# Patient Record
Sex: Male | Born: 1965 | Race: White | Hispanic: No | Marital: Single | Smoking: Current every day smoker
Health system: Southern US, Community
[De-identification: ages and names within clinical notes are randomized; demographics above are authoritative.]

## PROBLEM LIST (undated history)

## (undated) DIAGNOSIS — R7303 Prediabetes: Secondary | ICD-10-CM

## (undated) DIAGNOSIS — F17200 Nicotine dependence, unspecified, uncomplicated: Secondary | ICD-10-CM

## (undated) DIAGNOSIS — E785 Hyperlipidemia, unspecified: Secondary | ICD-10-CM

## (undated) DIAGNOSIS — I10 Essential (primary) hypertension: Secondary | ICD-10-CM

## (undated) HISTORY — PX: COLONOSCOPY: SHX174

## (undated) HISTORY — DX: Nicotine dependence, unspecified, uncomplicated: F17.200

## (undated) HISTORY — DX: Prediabetes: R73.03

## (undated) HISTORY — PX: UPPER GASTROINTESTINAL ENDOSCOPY: SHX188

## (undated) HISTORY — DX: Hyperlipidemia, unspecified: E78.5

---

## 2008-04-19 ENCOUNTER — Encounter: Admission: RE | Admit: 2008-04-19 | Discharge: 2008-04-19 | Payer: Self-pay | Admitting: Family Medicine

## 2017-08-19 DIAGNOSIS — I10 Essential (primary) hypertension: Secondary | ICD-10-CM | POA: Diagnosis not present

## 2017-08-19 DIAGNOSIS — J069 Acute upper respiratory infection, unspecified: Secondary | ICD-10-CM | POA: Diagnosis not present

## 2017-08-19 DIAGNOSIS — J029 Acute pharyngitis, unspecified: Secondary | ICD-10-CM | POA: Diagnosis not present

## 2018-03-11 ENCOUNTER — Ambulatory Visit (INDEPENDENT_AMBULATORY_CARE_PROVIDER_SITE_OTHER): Payer: 59 | Admitting: Family Medicine

## 2018-03-11 VITALS — BP 110/80 | HR 80 | Temp 97.9°F | Ht 70.0 in | Wt 202.8 lb

## 2018-03-11 DIAGNOSIS — Z0000 Encounter for general adult medical examination without abnormal findings: Secondary | ICD-10-CM

## 2018-03-11 NOTE — Patient Instructions (Signed)
It was great to meet you today! Thank you for letting me participate in your care!  Today, I performed a routine physical exam. You had no abnormal or concerning findings. We also discussed your recent desire to quit smoking. I would love to help you achieve the goal of being tobacco free.  Please schedule for an appointment in one month so we can discuss several strategies for achieving this goal.  Be well, [name] Brand Siever, DO PGY-1, [facility]

## 2018-03-11 NOTE — Progress Notes (Signed)
Subjective: Chief Complaint  Patient presents with  . Annual Exam  . Establish Care     HPI: Robert Lambert is a 52 y.o. presenting to clinic today to discuss the following:  Routine Yearly Physical Robert Lambert is a 52y/o male who presents needing a routine yearly physical for his work. He is being [facility]. He is unsure but is considering switching to our office due to the convenient location. He has no complaints. He is interested in quitting smoking. We discussed that if he would like to establish care with us he could return in one month and we would focus on smoking cessation techniques and possibly medications that could help him.  He denies fever, chills, weight loss, chest pain, SOB, difficulty breathing, abdominal pain, nausea, vomiting, urinary frequency, constipation, diarrhea, or blood in stool.  Health Maintenance: None     ROS noted in HPI.   Past Medical, Surgical, Social, and Family History Reviewed & Updated per EMR.   Pertinent Historical Findings include:   Social History   Tobacco Use  Smoking Status Not on file   Objective: BP 110/80 (BP Location: Left Arm, Patient Position: Sitting, Cuff Size: Normal)   Pulse 80   Temp 97.9 F (36.6 C) (Oral)   Ht 5\' 10"  (1.778 m)   Wt 202 lb 12.8 oz (92 kg)   SpO2 96%   BMI 29.10 kg/m  Vitals and nursing notes reviewed  Physical Exam  Constitutional: He is oriented to person, place, and time. He appears well-developed and well-nourished. No distress.  HENT:  Head: Normocephalic and atraumatic.  Right Ear: External ear normal.  Left Ear: External ear normal.  Nose: Nose normal.  Mouth/Throat: Oropharynx is clear and moist. No oropharyngeal exudate.  Eyes: Pupils are equal, round, and reactive to light. Conjunctivae and EOM are normal.  Neck: Normal range of motion. Neck supple. No thyromegaly present.  Cardiovascular: Normal rate, regular rhythm, normal heart  sounds and intact distal pulses.  No murmur heard. Pulmonary/Chest: Effort normal and breath sounds normal. No stridor. No respiratory distress. He has no wheezes. He has no rales.  Abdominal: Soft. Bowel sounds are normal. He exhibits no distension. There is no tenderness. There is no guarding. No hernia.  Musculoskeletal: Normal range of motion. He exhibits no edema or deformity.  Neurological: He is alert and oriented to person, place, and time. He displays normal reflexes. No cranial nerve deficit. Coordination normal.  Skin: Skin is warm and dry. Capillary refill takes less than 2 seconds. No rash noted. No erythema.   No results found for this or any previous visit (from the past 72 hour(s)).  Assessment/Plan:  Routine general medical examination at a health care facility Patient may establish care here but is unsure given he made this appointment to get a routine physical exam before a company deadline.  If he returns we will discuss smoking cessation and begin to address routine health maintenance.      PATIENT EDUCATION PROVIDED: See AVS    Diagnosis and plan along with any newly prescribed medication(s) were discussed in detail with this patient today. The patient verbalized understanding and agreed with the plan. Patient advised if symptoms worsen return to clinic or ER.   Health Maintainance:   No orders of the defined types were placed in this encounter.   No orders of the defined types were placed in this encounter.    Robert Schickim Vonzell Lindblad, DO 03/18/2018,  2:06 AM PGY-1, [facility]

## 2018-03-18 DIAGNOSIS — Z0000 Encounter for general adult medical examination without abnormal findings: Secondary | ICD-10-CM | POA: Insufficient documentation

## 2018-03-18 NOTE — Assessment & Plan Note (Signed)
Patient may establish care here but is unsure given he made this appointment to get a routine physical exam before a company deadline.  If he returns we will discuss smoking cessation and begin to address routine health maintenance.

## 2018-04-15 ENCOUNTER — Other Ambulatory Visit: Payer: Self-pay

## 2018-04-15 ENCOUNTER — Ambulatory Visit (INDEPENDENT_AMBULATORY_CARE_PROVIDER_SITE_OTHER): Payer: 59 | Admitting: Family Medicine

## 2018-04-15 ENCOUNTER — Encounter: Payer: Self-pay | Admitting: Family Medicine

## 2018-04-15 VITALS — BP 136/80 | HR 81 | Temp 98.5°F | Ht 70.0 in | Wt 208.0 lb

## 2018-04-15 DIAGNOSIS — I10 Essential (primary) hypertension: Secondary | ICD-10-CM | POA: Diagnosis not present

## 2018-04-15 DIAGNOSIS — F17200 Nicotine dependence, unspecified, uncomplicated: Secondary | ICD-10-CM | POA: Insufficient documentation

## 2018-04-15 MED ORDER — NICOTINE 21 MG/24HR TD PT24: 21.0000 mg | MEDICATED_PATCH | Freq: Every day | TRANSDERMAL | 0 refills | Status: DC

## 2018-04-15 MED ORDER — BUPROPION HCL ER (SR) 150 MG PO TB12: 150.0000 mg | ORAL_TABLET | Freq: Two times a day (BID) | ORAL | 2 refills | Status: DC

## 2018-04-15 MED ORDER — LISINOPRIL 10 MG PO TABS: 10.0000 mg | ORAL_TABLET | Freq: Every day | ORAL | 2 refills | Status: DC

## 2018-04-15 MED FILL — LISINOPRIL 10 MG TABLET: 10 | 30 days supply | Qty: 30 | Fill #0

## 2018-04-15 MED FILL — BUPROPION SR 150 MG TABLET: 150 | 30 days supply | Qty: 60 | Fill #0

## 2018-04-15 MED FILL — NICOTINE 21 MG/24HR PATCH: 21 | 28 days supply | Qty: 28 | Fill #0

## 2018-04-15 NOTE — Patient Instructions (Addendum)
It was great to see you today! Thank you for letting me participate in your care!  Today, we discussed smoking cessation. Please make use of the 1-800-QUITNOW or use the website at [email or web address]. I also started you on nicotine replacement therapy with a nicotine patch. I also started you on Buproprion also called Wellbutrin. Please take it as prescribed. I will see you back in one month.  I also refilled your Lisinopril. I will contact you once I have had time to review your records and let you know if there are any recommended tests that you may benefit from having.  Be well, [name] Mychael Smock, DO PGY-2, [facility]

## 2018-04-15 NOTE — Progress Notes (Signed)
Subjective: Chief Complaint  Patient presents with  . smoking cessation   HPI: Robert Lambert is a 52 y.o. presenting to clinic today to discuss the following:  Establish Care Will [facility] to ensure patient is up to date with routine health maintenance as he has no records in Epic.  Smoking Cessation Patient is motivated to stop smoking. He has been a smoker for over 35 years. He has never "really" tried to quit before. He currently smokes about 1PPD. He used to smoke more. He rates his motivation today at a level 5 when asked on a scale of 1 to 10 with 10 being the most motivated he has ever been and 1 being not motivated at all. He likes the taste of cigarettes and uses it as a stress relief. He knows however that they are bad for his health and he dislikes that they have given him difficulty breathing recently.  Health Maintenance: none today, pending records from previous PCP      ROS noted in HPI.   Past Medical, Surgical, Social, and Family History Reviewed & Updated per EMR.   Pertinent Historical Findings include:   Social History   Tobacco Use  Smoking Status Not on file    Objective: BP 136/80   Pulse 81   Temp 98.5 F (36.9 C) (Oral)   Ht 5\' 10"  (1.778 m)   Wt 208 lb (94.3 kg)   SpO2 97%   BMI 29.84 kg/m  Vitals and nursing notes reviewed  Physical Exam Gen: Alert and Oriented x 3, NAD HEENT: Normocephalic, atraumatic, PERRLA, EOMI CV: RRR, no murmurs, normal S1, S2 split, +2 pulses dorsalis pedis bilaterally Resp: CTAB, no wheezing, rales, or rhonchi, comfortable work of breathing Ext: no clubbing, cyanosis, or edema Skin: warm, dry, intact, no rashes  No results found for this or any previous visit (from the past 72 hour(s)).  Assessment/Plan:  Tobacco use disorder Starting nicotine patch at 21mg  given 1PPD use for over 30+ years. Also starting Buproprion. Counseled patient on possible side effects of  Bupropion. Gave patient information for counseling and help in stopping smoking with 1-800-QUITNC and have him the website address via AVS.  Will reassess in one month.  Essential hypertension Refilled Lisinopril   PATIENT EDUCATION PROVIDED: See AVS    Diagnosis and plan along with any newly prescribed medication(s) were discussed in detail with this patient today. The patient verbalized understanding and agreed with the plan. Patient advised if symptoms worsen return to clinic or ER.   Health Maintainance:   No orders of the defined types were placed in this encounter.   Meds ordered this encounter  Medications  . lisinopril (PRINIVIL,ZESTRIL) 10 MG tablet    Sig: Take 1 tablet (10 mg total) by mouth daily.    Dispense:  30 tablet    Refill:  2  . buPROPion (WELLBUTRIN SR) 150 MG 12 hr tablet    Sig: Take 1 tablet (150 mg total) by mouth 2 (two) times daily. First 3 days take just one pill in the morning. Starting on day 4 take two pills.    Dispense:  60 tablet    Refill:  2  . nicotine (NICODERM CQ - DOSED IN MG/24 HOURS) 21 mg/24hr patch    Sig: Place 1 patch (21 mg total) onto the skin daily.    Dispense:  28 patch    Refill:  0    Tim Karen ChafeLockamy, DO 04/15/2018, 3:49  PM PGY-2 [facility]

## 2018-04-20 DIAGNOSIS — I10 Essential (primary) hypertension: Secondary | ICD-10-CM | POA: Insufficient documentation

## 2018-04-20 NOTE — Assessment & Plan Note (Signed)
Starting nicotine patch at 21mg  given 1PPD use for over 30+ years. Also starting Buproprion. Counseled patient on possible side effects of Bupropion. Gave patient information for counseling and help in stopping smoking with 1-800-QUITNC and have him the website address via AVS.  Will reassess in one month.

## 2018-04-20 NOTE — Assessment & Plan Note (Signed)
Refilled Lisinopril. 

## 2018-05-21 ENCOUNTER — Ambulatory Visit: Payer: 59 | Admitting: Family Medicine

## 2018-05-29 DIAGNOSIS — Z024 Encounter for examination for driving license: Secondary | ICD-10-CM | POA: Diagnosis not present

## 2018-06-17 ENCOUNTER — Other Ambulatory Visit: Payer: Self-pay

## 2018-06-17 ENCOUNTER — Ambulatory Visit (INDEPENDENT_AMBULATORY_CARE_PROVIDER_SITE_OTHER): Payer: 59 | Admitting: Family Medicine

## 2018-06-17 ENCOUNTER — Encounter: Payer: Self-pay | Admitting: Family Medicine

## 2018-06-17 VITALS — BP 108/68 | HR 78 | Temp 98.1°F | Ht 70.0 in | Wt 209.0 lb

## 2018-06-17 DIAGNOSIS — F17200 Nicotine dependence, unspecified, uncomplicated: Secondary | ICD-10-CM | POA: Diagnosis not present

## 2018-06-17 MED ORDER — IPRATROPIUM BROMIDE 0.03 % NA SOLN: 2.0000 | Freq: Two times a day (BID) | NASAL | 12 refills | Status: DC

## 2018-06-17 MED ORDER — NICOTINE 14 MG/24HR TD PT24: 14.0000 mg | MEDICATED_PATCH | Freq: Every day | TRANSDERMAL | 2 refills | Status: DC

## 2018-06-17 MED FILL — IPRATROPIUM 0.03% SPRAY: 0.03 | 43 days supply | Qty: 30 | Fill #0

## 2018-06-17 MED FILL — NICOTINE 14 MG/24HR PATCH: 14 | 28 days supply | Qty: 28 | Fill #0

## 2018-06-17 NOTE — Patient Instructions (Addendum)
It was great to see you today! Thank you for letting me participate in your care!  Today, we discussed your continued steps toward successfully quitting smoking. I have refilled your Nicotine patches and decreased the dosage to 14mg . Please continue taking Wellbutrin every day as prescribed.  I have written you a prescription for Atrovent nasal spray to help with your cough that you have from a likely viral URI.  I will see you in 2 months.  Be well, [name] Mcadoo Muzquiz, DO PGY-2, [facility]

## 2018-06-17 NOTE — Progress Notes (Signed)
     Subjective: Chief Complaint  Patient presents with  . Follow-up     HPI: Robert Lambert is a 52 y.o. presenting to clinic today to discuss the following:  Smoking Cessation Patient states he has done better but is still struggling to quit smoking completely. He has considerable problems in the morning where he states first thing out of bed he wants a cigarette. He is smoking about 1/2 ppd down from at least 1ppd at the time of our last appointment. He has not identified any noticeable trigger such as meals, coffee, etc. He has not been using the nicotine patches regularly and only took Wellbutrin about halfthe time. He wants to continue taking both and commits to using both regularly and understands for the treatment to be most effective he must use it every day.  Health Maintenance: none     ROS noted in HPI.   Past Medical, Surgical, Social, and Family History Reviewed & Updated per EMR.   Pertinent Historical Findings include:   Social History   Tobacco Use  Smoking Status Current Every Day Smoker  . Years: 15.00  Smokeless Tobacco Never Used    Objective: BP 108/68   Pulse 78   Temp 98.1 F (36.7 C) (Oral)   Ht 5\' 10"  (1.778 m)   Wt 209 lb (94.8 kg)   SpO2 95%   BMI 29.99 kg/m  Vitals and nursing notes reviewed  Physical Exam Gen: Alert and Oriented x 3, NAD HEENT: Normocephalic, atraumatic CV: RRR, no murmurs, normal S1, S2 split Resp: mild bilateral wheezing, no rales, or rhonchi, comfortable work of breathing MSK: Moves all four extremities Ext: no clubbing, cyanosis, or edema Skin: warm, dry, intact, no rashes  No results found for this or any previous visit (from the past 72 hour(s)).  Assessment/Plan:  Tobacco use disorder Refill Wellbutrin and nicotine patch but decreasing nicotine patch dose from 21g to 14g. Patient to follow up in 2 months. Patient given contact info for [place] [facility] for internet and telephone support.   PATIENT EDUCATION  PROVIDED: See AVS    Diagnosis and plan along with any newly prescribed medication(s) were discussed in detail with this patient today. The patient verbalized understanding and agreed with the plan. Patient advised if symptoms worsen return to clinic or ER.   Health Maintainance:   No orders of the defined types were placed in this encounter.   No orders of the defined types were placed in this encounter.    [name] Glenetta Kiger, DO 06/17/2018, 3:04 PM PGY-2 [facility]

## 2018-06-22 NOTE — Assessment & Plan Note (Signed)
Refill Wellbutrin and nicotine patch but decreasing nicotine patch dose from 21g to 14g. Patient to follow up in 2 months. Patient given contact info for [place] [facility] for internet and telephone support.

## 2019-03-18 ENCOUNTER — Ambulatory Visit (INDEPENDENT_AMBULATORY_CARE_PROVIDER_SITE_OTHER): Payer: 59 | Admitting: Family Medicine

## 2019-03-18 ENCOUNTER — Other Ambulatory Visit: Payer: Self-pay

## 2019-03-18 VITALS — BP 138/80 | HR 78 | Temp 98.5°F | Wt 213.6 lb

## 2019-03-18 DIAGNOSIS — F17200 Nicotine dependence, unspecified, uncomplicated: Secondary | ICD-10-CM | POA: Diagnosis not present

## 2019-03-18 DIAGNOSIS — Z0000 Encounter for general adult medical examination without abnormal findings: Secondary | ICD-10-CM | POA: Diagnosis not present

## 2019-03-18 DIAGNOSIS — I10 Essential (primary) hypertension: Secondary | ICD-10-CM | POA: Diagnosis not present

## 2019-03-18 MED ORDER — LISINOPRIL 10 MG PO TABS: 10.0000 mg | ORAL_TABLET | Freq: Every day | ORAL | 11 refills | Status: DC

## 2019-03-18 NOTE — Patient Instructions (Signed)
It was great to see you today! Thank you for letting me participate in your care!  Today, we discussed your overall health. I recommend you stop smoking. It is the best thing to do for your overall health. When you are ready to quit again please call me for an appointment.  I have refilled your Lisinopril. Continue taking it as prescribed as your blood pressure is well controlled.  Be well, Harolyn Rutherford, DO PGY-2, [facility]

## 2019-03-18 NOTE — Progress Notes (Signed)
     Subjective: Chief Complaint  Patient presents with  . Annual Exam    HPI: Robert Lambert is a 53 y.o. presenting to clinic today to discuss the following:  Annual Wellness Exam Patient states he is here for he annual wellness exam. He is still smoking and not ready to quit. He has no complaints today. He states he has noticed over the past few weeks at times he has a low "ringing" in his ears sensation. He has been working in a very noisy environment lately.  HTN Well controlled today. He is compliant with taking Lisinopril everyday. He is not checking his BP at home regularly.  Smoking Cessation Counseled and encouraged to quit smoking. Patient endorses understanding of health benefits for quitting but is not ready to quit at this time.     He denies fever, cough, fatigue, SOB, chest pain, difficulty breathing, abdominal pain, nausea, vomiting, diarrhea, constipation, or blood in the stool.  ROS noted in HPI.   Past Medical, Surgical, Social, and Family History Reviewed & Updated per EMR.   Pertinent Historical Findings include:   Social History   Tobacco Use  Smoking Status Current Every Day Smoker  . Years: 15.00  Smokeless Tobacco Never Used    Objective: BP 138/80   Pulse 78   Temp 98.5 F (36.9 C) (Axillary)   Wt 213 lb 9.6 oz (96.9 kg)   SpO2 96%   BMI 30.65 kg/m  Vitals and nursing notes reviewed  Physical Exam Gen: Alert and Oriented x 3, NAD HEENT: Normocephalic, atraumatic, PERRLA, EOMI, TM visible with good light reflex, non-swollen, non-erythematous turbinates, non-erythematous pharyngeal mucosa, no exudates Neck: trachea midline, no thyroidmegaly, no LAD CV: RRR, no murmurs, normal S1, S2 split Resp: CTAB, no wheezing, rales, or rhonchi, comfortable work of breathing Abd: non-distended, non-tender, soft, +bs in all four quadrants MSK: Moves all four extremities Ext: no clubbing, cyanosis, or edema Neuro: No gross deficits Skin: warm, dry,  intact, no rashes  Assessment/Plan:  Tobacco use disorder Not ready to quit as he has restarted. - Encourage to quit smoking at the next appointment and if so restart Wellbutrin and Nicotine patch and gum.  Essential hypertension Well controlled at office visit today. Stable from last appointment. - Refill Lisinopril 10mg  daily   PATIENT EDUCATION PROVIDED: See AVS    Diagnosis and plan along with any newly prescribed medication(s) were discussed in detail with this patient today. The patient verbalized understanding and agreed with the plan. Patient advised if symptoms worsen return to clinic or ER.    No orders of the defined types were placed in this encounter.   Meds ordered this encounter  Medications  . lisinopril (ZESTRIL) 10 MG tablet    Sig: Take 1 tablet (10 mg total) by mouth daily.    Dispense:  30 tablet    Refill:  [place], DO 03/18/2019, 4:01 PM PGY-2 [facility]

## 2019-03-19 ENCOUNTER — Other Ambulatory Visit: Payer: Self-pay | Admitting: Family Medicine

## 2019-03-19 NOTE — Progress Notes (Unsigned)
     Subjective: No chief complaint on file.    HPI: Robert Lambert is a 53 y.o. presenting to clinic today to discuss the following:  1 2 3   Health Maintenance: ***     ROS noted in HPI.   Past Medical, Surgical, Social, and Family History Reviewed & Updated per EMR.   Pertinent Historical Findings include:   Social History   Tobacco Use  Smoking Status Current Every Day Smoker  . Years: 15.00  Smokeless Tobacco Never Used      Objective: There were no vitals taken for this visit. Vitals and nursing notes reviewed  Physical Exam   No results found for this or any previous visit (from the past 72 hour(s)).  Assessment/Plan:  No problem-specific Assessment & Plan notes found for this encounter.     PATIENT EDUCATION PROVIDED: See AVS    Diagnosis and plan along with any newly prescribed medication(s) were discussed in detail with this patient today. The patient verbalized understanding and agreed with the plan. Patient advised if symptoms worsen return to clinic or ER.    No orders of the defined types were placed in this encounter.   No orders of the defined types were placed in this encounter.    Harolyn Rutherford, DO 03/19/2019, 11:42 PM PGY-2 [facility]

## 2019-03-20 ENCOUNTER — Encounter: Payer: Self-pay | Admitting: Family Medicine

## 2019-03-20 NOTE — Assessment & Plan Note (Signed)
Not ready to quit as he has restarted. - Encourage to quit smoking at the next appointment and if so restart Wellbutrin and Nicotine patch and gum.

## 2019-03-20 NOTE — Assessment & Plan Note (Signed)
Well controlled at office visit today. Stable from last appointment. - Refill Lisinopril 10mg  daily

## 2019-09-08 ENCOUNTER — Telehealth (INDEPENDENT_AMBULATORY_CARE_PROVIDER_SITE_OTHER): Payer: 59 | Admitting: Family Medicine

## 2019-09-08 ENCOUNTER — Other Ambulatory Visit: Payer: Self-pay

## 2019-09-08 DIAGNOSIS — R05 Cough: Secondary | ICD-10-CM | POA: Diagnosis not present

## 2019-09-08 DIAGNOSIS — R059 Cough, unspecified: Secondary | ICD-10-CM

## 2019-09-08 NOTE — Progress Notes (Signed)
[facility] Telemedicine Visit  Patient consented to have virtual visit. Method of visit: Video was attempted, but technology challenges prevented patient from using video, so visit was conducted via telephone.  Encounter participants: Patient: Robert Lambert - located at home Provider: Rory Percy - located at [facility] Others (if applicable): n/a  Chief Complaint: Cough  HPI:  Patient reports slightly productive cough since yesterday morning, also associated with nausea and headache.  He is a current every day smoker with chronic cough.  States current cough is not much different than usual but slightly more intense.  He feels like he has deep chest congestion.  Denies chest pain, vomiting, fevers.  Does have slight shortness of breath when he is exerting himself more but he has not been walking much.  He is not on any inhalers.  He has not tried any medications.  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  He is not involved in any direct patient care.  He denies any sick contacts or known exposure to Covid.  He called health at work and got a Covid test done this morning, expecting results in 24-48 hours.  He has not been at work since this started and already has a note excusing him from work.  ROS: per HPI  Pertinent PMHx: HTN, tobacco use  Exam:  Respiratory: Speaking in full sentences, no respiratory distress.  No coughing during exam.  Assessment/Plan:  Cough Chronic but slightly worsened since yesterday, minimally productive and without fevers.  Already obtained Covid test, expecting results in 24-48 hours.  Given he does not appear to be in respiratory distress and is not having shortness of breath or chest pain at rest, no current indication for emergent presentation to ED.  Advised symptomatic care with OTC cough and cold medication, suggested Mucinex for congestion.  Also recommended tobacco cessation.  Return  precautions reviewed including fevers, vomiting, shortness of breath or chest pain at rest.  Advised to self isolate until test results return.  Patient verbalized understanding.   Time spent during visit with patient: 10 minutes

## 2019-09-09 DIAGNOSIS — R059 Cough, unspecified: Secondary | ICD-10-CM | POA: Insufficient documentation

## 2019-09-09 DIAGNOSIS — R05 Cough: Secondary | ICD-10-CM | POA: Insufficient documentation

## 2019-09-09 NOTE — Assessment & Plan Note (Signed)
Chronic but slightly worsened since yesterday, minimally productive and without fevers.  Already obtained Covid test, expecting results in 24-48 hours.  Given he does not appear to be in respiratory distress and is not having shortness of breath or chest pain at rest, no current indication for emergent presentation to ED.  Advised symptomatic care with OTC cough and cold medication, suggested Mucinex for congestion.  Also recommended tobacco cessation.  Return precautions reviewed including fevers, vomiting, shortness of breath or chest pain at rest.  Advised to self isolate until test results return.  Patient verbalized understanding.

## 2019-12-18 ENCOUNTER — Emergency Department (HOSPITAL_COMMUNITY)
Admission: EM | Admit: 2019-12-18 | Discharge: 2019-12-18 | Disposition: A | Discharge status: Home / Self Care | Payer: 59 | Attending: Emergency Medicine | Admitting: Emergency Medicine

## 2019-12-18 ENCOUNTER — Other Ambulatory Visit: Payer: Self-pay

## 2019-12-18 ENCOUNTER — Emergency Department (HOSPITAL_COMMUNITY): Payer: 59

## 2019-12-18 ENCOUNTER — Encounter (HOSPITAL_COMMUNITY): Payer: Self-pay | Admitting: *Deleted

## 2019-12-18 DIAGNOSIS — F17200 Nicotine dependence, unspecified, uncomplicated: Secondary | ICD-10-CM | POA: Insufficient documentation

## 2019-12-18 DIAGNOSIS — J40 Bronchitis, not specified as acute or chronic: Secondary | ICD-10-CM | POA: Insufficient documentation

## 2019-12-18 DIAGNOSIS — Z20822 Contact with and (suspected) exposure to covid-19: Secondary | ICD-10-CM | POA: Diagnosis not present

## 2019-12-18 DIAGNOSIS — R0602 Shortness of breath: Secondary | ICD-10-CM | POA: Diagnosis not present

## 2019-12-18 DIAGNOSIS — I10 Essential (primary) hypertension: Secondary | ICD-10-CM | POA: Diagnosis not present

## 2019-12-18 HISTORY — DX: Essential (primary) hypertension: I10

## 2019-12-18 LAB — BASIC METABOLIC PANEL
Anion gap: 11 (ref 5–15)
BUN: 9 mg/dL (ref 6–20)
CO2: 24 mmol/L (ref 22–32)
Calcium: 9.3 mg/dL (ref 8.9–10.3)
Chloride: 104 mmol/L (ref 98–111)
Creatinine, Ser: 0.98 mg/dL (ref 0.61–1.24)
GFR calc Af Amer: >60 mL/min (ref >60)
GFR calc non Af Amer: >60 mL/min (ref >60)
Glucose, Bld: 104 mg/dL — ABNORMAL HIGH (ref 70–99)
Potassium: 4.1 mmol/L (ref 3.5–5.1)
Sodium: 139 mmol/L (ref 135–145)

## 2019-12-18 LAB — TROPONIN I (HIGH SENSITIVITY)
Troponin I (High Sensitivity): 14 ng/L (ref <18)
Troponin I (High Sensitivity): 13 ng/L (ref <18)

## 2019-12-18 LAB — CBC
HCT: 52.6 % — ABNORMAL HIGH (ref 39.0–52.0)
Hemoglobin: 17.1 g/dL — ABNORMAL HIGH (ref 13.0–17.0)
MCH: 29.4 pg (ref 26.0–34.0)
MCHC: 32.5 g/dL (ref 30.0–36.0)
MCV: 90.5 fL (ref 80.0–100.0)
Platelets: 196 10*3/uL (ref 150–400)
RBC: 5.81 MIL/uL (ref 4.22–5.81)
RDW: 12.5 % (ref 11.5–15.5)
WBC: 6 10*3/uL (ref 4.0–10.5)
nRBC: 0 % (ref 0.0–0.2)

## 2019-12-18 LAB — SARS CORONAVIRUS 2 (TAT 6-24 HRS): SARS Coronavirus 2: NEGATIVE

## 2019-12-18 MED ORDER — PREDNISONE 20 MG PO TABS
60.0000 mg | ORAL_TABLET | Freq: Once | ORAL | Status: AC
Start: 2019-12-18 — End: 2019-12-18
  Administered 2019-12-18: 06:00:00 60 mg via ORAL
  Filled 2019-12-18: qty 3

## 2019-12-18 MED ORDER — DOXYCYCLINE HYCLATE 100 MG PO CAPS: 100.0000 mg | ORAL_CAPSULE | Freq: Two times a day (BID) | ORAL | 0 refills | Status: DC

## 2019-12-18 MED ORDER — SODIUM CHLORIDE 0.9% FLUSH: 3.0000 mL | Freq: Once | INTRAVENOUS | Status: DC

## 2019-12-18 MED ORDER — ALBUTEROL SULFATE HFA 108 (90 BASE) MCG/ACT IN AERS
2.0000 | INHALATION_SPRAY | RESPIRATORY_TRACT | Status: DC | PRN
  Administered 2019-12-18: 2 via RESPIRATORY_TRACT
  Filled 2019-12-18: qty 6.7

## 2019-12-18 MED ORDER — DOXYCYCLINE HYCLATE 100 MG PO TABS
100.0000 mg | ORAL_TABLET | Freq: Once | ORAL | Status: AC
  Administered 2019-12-18: 06:00:00 100 mg via ORAL
  Filled 2019-12-18: qty 1

## 2019-12-18 MED ORDER — PREDNISONE 20 MG PO TABS: 40.0000 mg | ORAL_TABLET | Freq: Every day | ORAL | 0 refills | Status: DC

## 2019-12-18 NOTE — ED Provider Notes (Signed)
[facility] EMERGENCY DEPARTMENT Provider Note   CSN: [id number] Arrival date & time: 12/18/19  0141     History Chief Complaint  Patient presents with  . Shortness of Breath    Robert Lambert is a 54 y.o. male.  Patient presents to the emergency department for evaluation of cold symptoms and shortness of breath.  Patient reports that he started early this morning with a scratchy throat when he woke up.  Over the course of the day he started having sneezing and a runny nose.  He has had a slight cough.  Patient reports that he worked a double shift today and then fell asleep tonight, but woke up feeling like he could not get his air.  No associated chest pain.  Breathing symptoms have resolved.        Past Medical History:  Diagnosis Date  . Hypertension     Patient Active Problem List   Diagnosis Date Noted  . Cough 09/09/2019  . Essential hypertension 04/20/2018  . Tobacco use disorder 04/15/2018  . Routine general medical examination at a health care facility 03/18/2018    History reviewed. No pertinent surgical history.     No family history on file.  Social History   Tobacco Use  . Smoking status: Current Every Day Smoker    Years: 15.00  . Smokeless tobacco: Never Used  Substance Use Topics  . Alcohol use: Yes    Comment: occas  . Drug use: Never    Home Medications Prior to Admission medications   Medication Sig Start Date End Date Taking? Authorizing Provider  buPROPion (WELLBUTRIN SR) 150 MG 12 hr tablet Take 1 tablet (150 mg total) by mouth 2 (two) times daily. First 3 days take just one pill in the morning. Starting on day 4 take two pills. Patient not taking: Reported on 12/18/2019 04/15/18   Arlyce Harman, DO  doxycycline (VIBRAMYCIN) 100 MG capsule Take 1 capsule (100 mg total) by mouth 2 (two) times daily. 12/18/19   Gilda Crease, MD  ipratropium (ATROVENT) 0.03 % nasal spray Place 2 sprays into both nostrils  every 12 (twelve) hours. Patient not taking: Reported on 12/18/2019 06/17/18   Arlyce Harman, DO  lisinopril (ZESTRIL) 10 MG tablet Take 1 tablet (10 mg total) by mouth daily. Patient not taking: Reported on 12/18/2019 03/18/19   Arlyce Harman, DO  nicotine (NICODERM CQ - DOSED IN MG/24 HOURS) 14 mg/24hr patch Place 1 patch (14 mg total) onto the skin daily. Patient not taking: Reported on 12/18/2019 06/17/18   Arlyce Harman, DO  predniSONE (DELTASONE) 20 MG tablet Take 2 tablets (40 mg total) by mouth daily with breakfast. 12/18/19   Brandis Wixted, Canary Brim, MD    Allergies    Patient has no known allergies.  Review of Systems   Review of Systems  HENT: Positive for congestion and sore throat.   Respiratory: Positive for cough and shortness of breath.   All other systems reviewed and are negative.   Physical Exam Updated Vital Signs BP (!) 136/97 (BP Location: Right Arm)   Pulse 76   Temp 98.5 F (36.9 C) (Oral)   Resp 18   Ht 5\' 10"  (1.778 m)   Wt 95.3 kg   SpO2 97%   BMI 30.13 kg/m   Physical Exam Vitals and nursing note reviewed.  Constitutional:      General: He is not in acute distress.    Appearance: Normal appearance. He is well-developed.  HENT:  Head: Normocephalic and atraumatic.     Right Ear: Hearing normal.     Left Ear: Hearing normal.     Nose: Nose normal.  Eyes:     Conjunctiva/sclera: Conjunctivae normal.     Pupils: Pupils are equal, round, and reactive to light.  Cardiovascular:     Rate and Rhythm: Regular rhythm.     Heart sounds: S1 normal and S2 normal. No murmur. No friction rub. No gallop.   Pulmonary:     Effort: Pulmonary effort is normal. No respiratory distress.     Breath sounds: Normal breath sounds.  Chest:     Chest wall: No tenderness.  Abdominal:     General: Bowel sounds are normal.     Palpations: Abdomen is soft.     Tenderness: There is no abdominal tenderness. There is no guarding or rebound. Negative signs include  Murphy's sign and McBurney's sign.     Hernia: No hernia is present.  Musculoskeletal:        General: Normal range of motion.     Cervical back: Normal range of motion and neck supple.  Skin:    General: Skin is warm and dry.     Findings: No rash.  Neurological:     Mental Status: He is alert and oriented to person, place, and time.     GCS: GCS eye subscore is 4. GCS verbal subscore is 5. GCS motor subscore is 6.     Cranial Nerves: No cranial nerve deficit.     Sensory: No sensory deficit.     Coordination: Coordination normal.  Psychiatric:        Speech: Speech normal.        Behavior: Behavior normal.        Thought Content: Thought content normal.     ED Results / Procedures / Treatments   Labs (all labs ordered are listed, but only abnormal results are displayed) Labs Reviewed  BASIC METABOLIC PANEL - Abnormal; Notable for the following components:      Result Value   Glucose, Bld 104 (*)    All other components within normal limits  CBC - Abnormal; Notable for the following components:   Hemoglobin 17.1 (*)    HCT 52.6 (*)    All other components within normal limits  SARS CORONAVIRUS 2 (TAT 6-24 HRS)  TROPONIN I (HIGH SENSITIVITY)  TROPONIN I (HIGH SENSITIVITY)    EKG EKG Interpretation  Date/Time:  Saturday December 18 2019 01:42:31 EDT Ventricular Rate:  98 PR Interval:  164 QRS Duration: 114 QT Interval:  368 QTC Calculation: 469 R Axis:   66 Text Interpretation: Normal sinus rhythm Normal ECG Confirmed by Orpah Greek [phone]) on 12/18/2019 5:05:00 AM   Radiology DG Chest 2 View  Result Date: 12/18/2019 CLINICAL DATA:  Shortness of breath EXAM: CHEST - 2 VIEW COMPARISON:  04/19/2008 FINDINGS: Heart and mediastinal contours are within normal limits. No focal opacities or effusions. No acute bony abnormality. IMPRESSION: No active cardiopulmonary disease. Electronically Signed   By: Rolm Baptise M.D.   On: 12/18/2019 02:24    Procedures  Procedures (including critical care time)  Medications Ordered in ED Medications  sodium chloride flush (NS) 0.9 % injection 3 mL (has no administration in time range)  doxycycline (VIBRA-TABS) tablet 100 mg (has no administration in time range)  predniSONE (DELTASONE) tablet 60 mg (has no administration in time range)  albuterol (VENTOLIN HFA) 108 (90 Base) MCG/ACT inhaler 2 puff (has no administration in  time range)    ED Course  I have reviewed the triage vital signs and the nursing notes.  Pertinent labs & imaging results that were available during my care of the patient were reviewed by me and considered in my medical decision making (see chart for details).    MDM Rules/Calculators/A&P                      Patient appears well.  He had an episode of shortness of breath earlier tonight that has resolved.  He does have a long smoking history but no definitive COPD diagnosis.  He is breathing comfortably currently.  Lungs are clear.  Oxygenation is normal.  Cardiac evaluation is unremarkable.  EKG is normal.  Troponin normal x2.  Chest x-ray clear, no congestive heart failure, no pneumonia or pneumonitis.  Patient reassured, treat for likely bronchospasm.  Will perform Covid testing.  Final Clinical Impression(s) / ED Diagnoses Final diagnoses:  Bronchitis    Rx / DC Orders ED Discharge Orders         Ordered    doxycycline (VIBRAMYCIN) 100 MG capsule  2 times daily     12/18/19 0553    predniSONE (DELTASONE) 20 MG tablet  Daily with breakfast     12/18/19 0553           Gilda Crease, MD 12/18/19 [phone]

## 2019-12-18 NOTE — ED Notes (Signed)
Pt verbalized understanding of d/c instructions, follow up care, s/s requiring return to ed and prescriptions. Pt had no further questions at this time.  

## 2019-12-18 NOTE — ED Triage Notes (Signed)
Pt says that this morning he felt like he had a cold (coughing and sneezing). He laid down around midnight because he was not feeling well. When he woke up he felt some shortness of breath. Says he feels like something "is blocking my windpipe". NAD in triage. Pt hypertensive in triage, says he is not taking his prescribed BP meds.

## 2020-05-16 ENCOUNTER — Other Ambulatory Visit: Payer: Self-pay | Admitting: Family Medicine

## 2020-05-17 MED FILL — LISINOPRIL 10 MG TABS: 10 | 30 days supply | Qty: 30 | Fill #0

## 2020-06-28 MED FILL — LISINOPRIL 10 MG TABS: 10 | 30 days supply | Qty: 30 | Fill #1

## 2020-08-21 MED FILL — LISINOPRIL 10 MG TABS: 10 | 30 days supply | Qty: 30 | Fill #2

## 2020-10-13 MED FILL — LISINOPRIL 10 MG TABS: 10 | 30 days supply | Qty: 30 | Fill #3

## 2020-12-01 DIAGNOSIS — Z021 Encounter for pre-employment examination: Secondary | ICD-10-CM | POA: Diagnosis not present

## 2020-12-04 MED FILL — LISINOPRIL 10 MG TABS: 10 | 30 days supply | Qty: 30 | Fill #4

## 2021-01-29 ENCOUNTER — Other Ambulatory Visit (HOSPITAL_COMMUNITY): Payer: Self-pay

## 2021-01-29 MED FILL — Lisinopril Tab 10 MG: ORAL | 30 days supply | Qty: 30 | Fill #0 | Status: AC

## 2021-01-30 ENCOUNTER — Other Ambulatory Visit (HOSPITAL_COMMUNITY): Payer: Self-pay

## 2021-02-21 ENCOUNTER — Other Ambulatory Visit (HOSPITAL_COMMUNITY): Payer: Self-pay

## 2021-02-21 ENCOUNTER — Other Ambulatory Visit: Payer: Self-pay

## 2021-02-21 ENCOUNTER — Ambulatory Visit (INDEPENDENT_AMBULATORY_CARE_PROVIDER_SITE_OTHER): Payer: 59 | Admitting: Family Medicine

## 2021-02-21 ENCOUNTER — Encounter: Payer: Self-pay | Admitting: Family Medicine

## 2021-02-21 VITALS — BP 116/72 | HR 95 | Wt 211.4 lb

## 2021-02-21 DIAGNOSIS — Z114 Encounter for screening for human immunodeficiency virus [HIV]: Secondary | ICD-10-CM

## 2021-02-21 DIAGNOSIS — Z1211 Encounter for screening for malignant neoplasm of colon: Secondary | ICD-10-CM

## 2021-02-21 DIAGNOSIS — I10 Essential (primary) hypertension: Secondary | ICD-10-CM

## 2021-02-21 DIAGNOSIS — Z0000 Encounter for general adult medical examination without abnormal findings: Secondary | ICD-10-CM

## 2021-02-21 DIAGNOSIS — Z13220 Encounter for screening for lipoid disorders: Secondary | ICD-10-CM | POA: Diagnosis not present

## 2021-02-21 DIAGNOSIS — Z7289 Other problems related to lifestyle: Secondary | ICD-10-CM | POA: Diagnosis not present

## 2021-02-21 DIAGNOSIS — F17200 Nicotine dependence, unspecified, uncomplicated: Secondary | ICD-10-CM

## 2021-02-21 DIAGNOSIS — Z131 Encounter for screening for diabetes mellitus: Secondary | ICD-10-CM

## 2021-02-21 DIAGNOSIS — Z789 Other specified health status: Secondary | ICD-10-CM | POA: Diagnosis not present

## 2021-02-21 DIAGNOSIS — Z1159 Encounter for screening for other viral diseases: Secondary | ICD-10-CM

## 2021-02-21 DIAGNOSIS — F1090 Alcohol use, unspecified, uncomplicated: Secondary | ICD-10-CM | POA: Insufficient documentation

## 2021-02-21 MED ORDER — LISINOPRIL 10 MG PO TABS
10.0000 mg | ORAL_TABLET | Freq: Every day | ORAL | 11 refills | Status: DC
  Filled 2021-02-21: qty 30, 30d supply, fill #0
  Filled 2021-05-10: qty 30, 30d supply, fill #1
  Filled 2021-06-20: qty 30, 30d supply, fill #2
  Filled 2021-07-30: qty 30, 30d supply, fill #3
  Filled 2021-09-12: qty 30, 30d supply, fill #4
  Filled 2021-10-30: qty 30, 30d supply, fill #5
  Filled 2021-12-05: qty 30, 30d supply, fill #6
  Filled 2022-01-17 – 2022-01-18 (×2): qty 30, 30d supply, fill #7

## 2021-02-21 NOTE — Assessment & Plan Note (Signed)
40 pack year history. Patient is not interested in cutting back/quitting at this time.  -Low dose lung CT ordered today for lungs cancer screening -Patient encouraged to contact the office if he's interested in smoking cessation -Will continue to address at future visits

## 2021-02-21 NOTE — Progress Notes (Signed)
    SUBJECTIVE:   Chief complaint/HPI: annual examination and HTN follow-up  Robert Lambert is a 55 y.o. who presents today for an annual exam. He does not have any specific concerns.  History tabs reviewed and updated.   HTN Patient with longstanding history of HTN. He is on Lisinopril 10mg  daily. Reports excellent compliance. States he "does not pay attention" to salt intake or overall dietary habits. Last BMP on 12/18/2019 was within normal limits.  Tobacco Use Started smoking at age 67, currently smokes 1 PPD. Tried to quit using nicotine patch and wellbutrin in 2019 but was unsuccessful. Patient reports he was not ready to quit at that time but tried half-heartedly anyway. He is not interested in cutting back or quitting today.   OBJECTIVE:   BP 116/72   Pulse 95   Wt 211 lb 6.4 oz (95.9 kg)   SpO2 96%   BMI 30.33 kg/m   General: NAD, pleasant, able to participate in exam HEENT: edentulous, oropharynx otherwise unremarkable Cardiac: RRR, S1S2 present. normal heart sounds, no murmurs. Respiratory: CTAB, normal effort, No wheezes, rales or rhonchi Abdomen: non-tender, non-distended Extremities: no edema or cyanosis Skin: warm and dry, no rashes noted Neuro: alert, no obvious focal deficits Psych: Normal affect and mood  ASSESSMENT/PLAN:   Annual Examination  PHQ score 1, reviewed.  BP reviewed and at goal.  Asked about intimate partner violence (negative)  Advance directives discussion: given blue advanced directives packet. Patient will begin thinking about code status/who he would like to make medical decisions for him if needed.  Ordered the following items based upon USPSTF recommendations: -Diabetes screening -Screening for elevated cholesterol -HIV testing -Hepatitis C -Colorectal cancer screening with colonoscopy -Lung cancer screening with low dose CT scan   Essential hypertension Stable, well-controlled. -Refills provided for Lisinopril 10mg   daily -Will check CMP today (added liver enzymes due to history of alcohol intake above recommended limits)  Tobacco use disorder 40 pack year history. Patient is not interested in cutting back/quitting at this time.  -Low dose lung CT ordered today for lungs cancer screening -Patient encouraged to contact the office if he's interested in smoking cessation -Will continue to address at future visits     2020, MD [facility]

## 2021-02-21 NOTE — Patient Instructions (Addendum)
It was great to meet you!  Things we discussed at today's visit: - We are checking some labs. We will send you a letter with the results or call if they are abnormal.  - I have ordered a CT to screen for lung cancer and have placed a referral for your colonoscopy. Someone will call you to schedule these appointments. - If you decide you want to cut back/quit smoking, please call our office. We would be happy to help you with this!  - Look over the blue Advanced Directives packet and return to our office at your convenience. Let me know if you have any questions or want to discuss further.  Take care and seek immediate care sooner if you develop any concerns.   Dr. [facility]

## 2021-02-21 NOTE — Assessment & Plan Note (Signed)
Stable, well-controlled. -Refills provided for Lisinopril 10mg  daily -Will check CMP today (added liver enzymes due to history of alcohol intake above recommended limits)

## 2021-02-22 ENCOUNTER — Other Ambulatory Visit (HOSPITAL_COMMUNITY): Payer: Self-pay

## 2021-02-22 LAB — COMPREHENSIVE METABOLIC PANEL
ALT: 19 IU/L (ref 0–44)
AST: 16 IU/L (ref 0–40)
Albumin/Globulin Ratio: 1.6 (ref 1.2–2.2)
Albumin: 4.1 g/dL (ref 3.8–4.9)
Alkaline Phosphatase: 73 IU/L (ref 44–121)
BUN/Creatinine Ratio: 11 (ref 9–20)
BUN: 13 mg/dL (ref 6–24)
Bilirubin Total: 0.2 mg/dL (ref 0.0–1.2)
CO2: 21 mmol/L (ref 20–29)
Calcium: 9.3 mg/dL (ref 8.7–10.2)
Chloride: 100 mmol/L (ref 96–106)
Creatinine, Ser: 1.17 mg/dL (ref 0.76–1.27)
Globulin, Total: 2.6 g/dL (ref 1.5–4.5)
Glucose: 96 mg/dL (ref 65–99)
Potassium: 4.5 mmol/L (ref 3.5–5.2)
Sodium: 144 mmol/L (ref 134–144)
Total Protein: 6.7 g/dL (ref 6.0–8.5)
eGFR: 74 mL/min/{1.73_m2} (ref >59)

## 2021-02-22 LAB — LIPID PANEL
Chol/HDL Ratio: 8.5 ratio — ABNORMAL HIGH (ref 0.0–5.0)
Cholesterol, Total: 187 mg/dL (ref 100–199)
HDL: 22 mg/dL — ABNORMAL LOW (ref >39)
LDL Chol Calc (NIH): 75 mg/dL (ref 0–99)
Triglycerides: 571 mg/dL (ref 0–149)
VLDL Cholesterol Cal: 90 mg/dL — ABNORMAL HIGH (ref 5–40)

## 2021-02-22 LAB — HEMOGLOBIN A1C
Est. average glucose Bld gHb Est-mCnc: 117 mg/dL
Hgb A1c MFr Bld: 5.7 % — ABNORMAL HIGH (ref 4.8–5.6)

## 2021-02-22 LAB — HCV INTERPRETATION

## 2021-02-22 LAB — HCV AB W REFLEX TO QUANT PCR: HCV Ab: <0.1 s/co ratio (ref 0.0–0.9)

## 2021-02-22 LAB — HIV ANTIBODY (ROUTINE TESTING W REFLEX): HIV Screen 4th Generation wRfx: NONREACTIVE

## 2021-02-27 ENCOUNTER — Other Ambulatory Visit (HOSPITAL_COMMUNITY): Payer: Self-pay

## 2021-03-06 ENCOUNTER — Telehealth: Payer: Self-pay | Admitting: *Deleted

## 2021-03-06 NOTE — Addendum Note (Signed)
Addended by: Lamonte Sakai, Lennart Gladish D on: 03/06/2021 04:42 PM   Modules accepted: Orders

## 2021-03-06 NOTE — Telephone Encounter (Signed)
Contacted pt to give him his appointment time for his CT which is 03/20/21 at [facility].  He said that the scheduled time would not work due to him being on vacation.  Provided him with the scheduling number so he can schedule his appointment around his schedule.Tyaira Heward Zimmerman Rumple, CMA

## 2021-03-20 ENCOUNTER — Ambulatory Visit (HOSPITAL_COMMUNITY): Payer: 59

## 2021-05-10 ENCOUNTER — Other Ambulatory Visit (HOSPITAL_COMMUNITY): Payer: Self-pay

## 2021-05-11 ENCOUNTER — Ambulatory Visit: Payer: 59 | Admitting: Family Medicine

## 2021-05-11 ENCOUNTER — Other Ambulatory Visit: Payer: Self-pay

## 2021-05-11 ENCOUNTER — Encounter: Payer: Self-pay | Admitting: Family Medicine

## 2021-05-11 VITALS — BP 120/70 | HR 84 | Ht 70.0 in | Wt 199.0 lb

## 2021-05-11 DIAGNOSIS — Z0000 Encounter for general adult medical examination without abnormal findings: Secondary | ICD-10-CM | POA: Diagnosis not present

## 2021-05-11 DIAGNOSIS — E785 Hyperlipidemia, unspecified: Secondary | ICD-10-CM

## 2021-05-11 DIAGNOSIS — F17200 Nicotine dependence, unspecified, uncomplicated: Secondary | ICD-10-CM | POA: Diagnosis not present

## 2021-05-11 DIAGNOSIS — R7303 Prediabetes: Secondary | ICD-10-CM | POA: Insufficient documentation

## 2021-05-11 MED ORDER — ZOSTER VAC RECOMB ADJUVANTED 50 MCG/0.5ML IM SUSR: 0.5000 mL | Freq: Once | INTRAMUSCULAR | 0 refills | Status: AC

## 2021-05-11 NOTE — Progress Notes (Signed)
    SUBJECTIVE:   CHIEF COMPLAINT / HPI:   Cholesterol follow-up Patient here to follow-up on his cholesterol.  At his last visit on 02/21/21 his triglycerides were significantly elevated at 579 and his HDL was low at 22.  He was not fasting that day. Does not exercise regularly although his job does involve some physical labor.   PERTINENT  PMH / PSH: HTN, tobacco use  OBJECTIVE:   BP 120/70   Pulse 84   Ht 5\' 10"  (1.778 m)   Wt 199 lb (90.3 kg)   SpO2 98%   BMI 28.55 kg/m   Gen: alert, well-appearing, NAD CV: RRR, normal S1/S2 without m/r/g Resp: normal effort Neuro: no obvious focal decficits Psych: appropriate mood and affect  ASSESSMENT/PLAN:   Dyslipidemia Most recent lipid panel on 02/21/21 showed significantly elevated triglycerides at 571 and low HDL (22). LDL wnl at 75 and total cholesterol 187. He was not fasting that day and unfortunately is not fasting today either. -Scheduled lab visit for fasting lipid panel -If triglycerides persistently elevated, will start medication therapy (statin vs vascepa) -Recommended lifestyle modifications including regular exercise, healthy dietary choices, and smoking cessation  Tobacco use disorder Patient continues to smoke 1PPD. He is not interested in cutting back or quitting at this time. Counseled on importance of smoking cessation, particularly given his other cardiac risk factors (HTN, HLD, prediabetes). He will contact our office if/when he's ready to cut back/quit.  Health Maintenance Low dose lung CT and colonoscopy were both ordered at his last visit but have not been scheduled or completed. Assisted patient with scheduling low dose lung CT and provided phone # for GI to schedule colonoscopy. PCV20 given today Printed Rx for Shingrix, he will have this done at his pharmacy   02/23/21, MD [facility]

## 2021-05-11 NOTE — Assessment & Plan Note (Signed)
Patient continues to smoke 1PPD. He is not interested in cutting back or quitting at this time. Counseled on importance of smoking cessation, particularly given his other cardiac risk factors (HTN, HLD, prediabetes). He will contact our office if/when he's ready to cut back/quit.

## 2021-05-11 NOTE — Assessment & Plan Note (Addendum)
Most recent lipid panel on 02/21/21 showed significantly elevated triglycerides at 571 and low HDL (22). LDL wnl at 75 and total cholesterol 187. He was not fasting that day and unfortunately is not fasting today either. -Scheduled lab visit for fasting lipid panel -If triglycerides persistently elevated, will start medication therapy (statin vs vascepa) -Recommended lifestyle modifications including regular exercise, healthy dietary choices, and smoking cessation

## 2021-05-11 NOTE — Patient Instructions (Addendum)
It was great to see you!  Things we discussed at today's visit: - We did your pneumonia vaccine today. Usually the only side effect is a sore arm.  - I have printed a prescription for your Shingles vaccine. You can take this to any pharmacy to get it done. Sometimes people have fatigue, body aches, and low grade fever for 24-48 hours after this vaccine. You can take Tylenol if this happens. - The absolute best thing you can do for your health is to quit smoking. Please let me know when you're ready to cut back/quit and we can work together. - Please call [facility] to schedule your colonoscopy. The phone # is [phone] - We scheduled a lab visit to recheck your cholesterol. Make sure you do not eat anything the morning of this appointment. I will call you with the results if we need to start medication.  Take care and seek immediate care sooner if you develop any concerns.  Dr. [facility]

## 2021-05-21 ENCOUNTER — Other Ambulatory Visit: Payer: 59

## 2021-05-21 ENCOUNTER — Other Ambulatory Visit: Payer: Self-pay

## 2021-05-23 ENCOUNTER — Ambulatory Visit (HOSPITAL_COMMUNITY)
Admission: RE | Admit: 2021-05-23 | Discharge: 2021-05-23 | Disposition: A | Discharge status: Home / Self Care | Payer: 59 | Source: Ambulatory Visit | Attending: Family Medicine | Admitting: Family Medicine

## 2021-05-23 ENCOUNTER — Other Ambulatory Visit: Payer: Self-pay

## 2021-05-23 DIAGNOSIS — F17200 Nicotine dependence, unspecified, uncomplicated: Secondary | ICD-10-CM | POA: Insufficient documentation

## 2021-05-23 DIAGNOSIS — Z87891 Personal history of nicotine dependence: Secondary | ICD-10-CM | POA: Diagnosis not present

## 2021-05-23 DIAGNOSIS — Z0000 Encounter for general adult medical examination without abnormal findings: Secondary | ICD-10-CM | POA: Diagnosis not present

## 2021-05-24 ENCOUNTER — Other Ambulatory Visit: Payer: 59

## 2021-05-24 ENCOUNTER — Other Ambulatory Visit: Payer: Self-pay

## 2021-05-24 DIAGNOSIS — E785 Hyperlipidemia, unspecified: Secondary | ICD-10-CM

## 2021-05-25 LAB — LIPID PANEL
Chol/HDL Ratio: 7.2 ratio — ABNORMAL HIGH (ref 0.0–5.0)
Cholesterol, Total: 179 mg/dL (ref 100–199)
HDL: 25 mg/dL — ABNORMAL LOW (ref >39)
LDL Chol Calc (NIH): 96 mg/dL (ref 0–99)
Triglycerides: 347 mg/dL — ABNORMAL HIGH (ref 0–149)
VLDL Cholesterol Cal: 58 mg/dL — ABNORMAL HIGH (ref 5–40)

## 2021-06-20 ENCOUNTER — Other Ambulatory Visit (HOSPITAL_COMMUNITY): Payer: Self-pay

## 2021-07-28 ENCOUNTER — Other Ambulatory Visit: Payer: Self-pay | Admitting: Family Medicine

## 2021-07-28 DIAGNOSIS — E785 Hyperlipidemia, unspecified: Secondary | ICD-10-CM

## 2021-07-28 MED ORDER — ROSUVASTATIN CALCIUM 10 MG PO TABS
10.0000 mg | ORAL_TABLET | Freq: Every day | ORAL | 1 refills | Status: DC
  Filled 2021-07-28: qty 90, 90d supply, fill #0

## 2021-07-30 ENCOUNTER — Other Ambulatory Visit (HOSPITAL_COMMUNITY): Payer: Self-pay

## 2021-09-12 ENCOUNTER — Other Ambulatory Visit (HOSPITAL_COMMUNITY): Payer: Self-pay

## 2021-10-30 ENCOUNTER — Other Ambulatory Visit (HOSPITAL_COMMUNITY): Payer: Self-pay

## 2021-11-14 ENCOUNTER — Other Ambulatory Visit: Payer: Self-pay | Admitting: Family Medicine

## 2021-11-14 DIAGNOSIS — E785 Hyperlipidemia, unspecified: Secondary | ICD-10-CM

## 2021-11-14 MED ORDER — ROSUVASTATIN CALCIUM 10 MG PO TABS: 10.0000 mg | ORAL_TABLET | Freq: Every day | ORAL | 1 refills | Status: DC

## 2021-12-05 ENCOUNTER — Other Ambulatory Visit (HOSPITAL_COMMUNITY): Payer: Self-pay

## 2021-12-07 ENCOUNTER — Other Ambulatory Visit (HOSPITAL_COMMUNITY): Payer: Self-pay

## 2021-12-07 ENCOUNTER — Other Ambulatory Visit: Payer: Self-pay | Admitting: Family Medicine

## 2021-12-07 DIAGNOSIS — E785 Hyperlipidemia, unspecified: Secondary | ICD-10-CM

## 2021-12-07 MED ORDER — ROSUVASTATIN CALCIUM 10 MG PO TABS
10.0000 mg | ORAL_TABLET | Freq: Every day | ORAL | 1 refills | Status: DC
  Filled 2021-12-07 – 2021-12-14 (×2): qty 90, 90d supply, fill #0

## 2021-12-14 ENCOUNTER — Other Ambulatory Visit (HOSPITAL_COMMUNITY): Payer: Self-pay

## 2022-01-17 ENCOUNTER — Other Ambulatory Visit: Payer: Self-pay

## 2022-01-18 ENCOUNTER — Other Ambulatory Visit: Payer: Self-pay

## 2022-01-18 ENCOUNTER — Other Ambulatory Visit (HOSPITAL_COMMUNITY): Payer: Self-pay

## 2022-02-28 ENCOUNTER — Other Ambulatory Visit: Payer: Self-pay | Admitting: Family Medicine

## 2022-02-28 ENCOUNTER — Other Ambulatory Visit (HOSPITAL_COMMUNITY): Payer: Self-pay

## 2022-02-28 DIAGNOSIS — I10 Essential (primary) hypertension: Secondary | ICD-10-CM

## 2022-02-28 MED ORDER — LISINOPRIL 10 MG PO TABS
10.0000 mg | ORAL_TABLET | Freq: Every day | ORAL | 0 refills | Status: DC
  Filled 2022-02-28: qty 90, 90d supply, fill #0

## 2022-05-13 ENCOUNTER — Other Ambulatory Visit: Payer: Self-pay

## 2022-05-13 ENCOUNTER — Ambulatory Visit (INDEPENDENT_AMBULATORY_CARE_PROVIDER_SITE_OTHER): Payer: 59 | Admitting: Family Medicine

## 2022-05-13 ENCOUNTER — Other Ambulatory Visit (HOSPITAL_COMMUNITY): Payer: Self-pay

## 2022-05-13 ENCOUNTER — Encounter: Payer: Self-pay | Admitting: Family Medicine

## 2022-05-13 VITALS — BP 129/88 | HR 83 | Wt 201.6 lb

## 2022-05-13 DIAGNOSIS — Z122 Encounter for screening for malignant neoplasm of respiratory organs: Secondary | ICD-10-CM | POA: Diagnosis not present

## 2022-05-13 DIAGNOSIS — E785 Hyperlipidemia, unspecified: Secondary | ICD-10-CM

## 2022-05-13 DIAGNOSIS — Z0000 Encounter for general adult medical examination without abnormal findings: Secondary | ICD-10-CM | POA: Diagnosis not present

## 2022-05-13 DIAGNOSIS — Z125 Encounter for screening for malignant neoplasm of prostate: Secondary | ICD-10-CM | POA: Diagnosis not present

## 2022-05-13 DIAGNOSIS — R7303 Prediabetes: Secondary | ICD-10-CM | POA: Diagnosis not present

## 2022-05-13 DIAGNOSIS — F17200 Nicotine dependence, unspecified, uncomplicated: Secondary | ICD-10-CM | POA: Diagnosis not present

## 2022-05-13 DIAGNOSIS — I10 Essential (primary) hypertension: Secondary | ICD-10-CM | POA: Diagnosis not present

## 2022-05-13 MED ORDER — ROSUVASTATIN CALCIUM 10 MG PO TABS
10.0000 mg | ORAL_TABLET | Freq: Every day | ORAL | 3 refills | Status: DC
  Filled 2022-05-13: qty 90, 90d supply, fill #0

## 2022-05-13 MED ORDER — ZOSTER VAC RECOMB ADJUVANTED 50 MCG/0.5ML IM SUSR: 0.5000 mL | Freq: Once | INTRAMUSCULAR | 0 refills | Status: AC

## 2022-05-13 NOTE — Patient Instructions (Addendum)
It was great to see you!  Things we discussed at today's visit: - Let me know when you're ready to quit smoking. This is the single best thing you can do for your health. - I have ordered a lung CT (lung cancer screening to be done annually). Someone will call you to schedule. - We are checking several labs today (cholesterol, kidney function, prediabetes test, prostate cancer screening). We will send you a letter with the results or call if they are abnormal.  - Call [facility] to schedule your colonoscopy. If you need a new referral please let our office know. - Get your shingles shot at the pharmacy at your earliest convenience. This can cause flu-like symptoms (fatigue, headache, fever, body aches) for 24-48 hours after.  Take care and seek immediate care sooner if you develop any concerns.  Dr. [facility]   Things to do to keep yourself healthy  - Exercise at least 30-45 minutes a day, 3-4 days a week.  - Eat a diet with lots of fruits and vegetables (5 servings per day). - Avoid sugar sweetened beverages and processed foods whenever possible. - Seatbelts can save your life. Wear them always.  - Safe sex - use a condom to prevent STIs. - Alcohol -  If you drink, do it moderately, less than 2 drinks per day. - Sleep - aim for 7-8 hours nightly. - Limit screen time to no more than 2 hours per day.  - Health Care Power of Attorney. Choose someone to make decisions for you if you are not able.  - Depression is common in our stressful world. If you're feeling down or losing interest in things you normally enjoy, please come in for a visit.  - Violence - If anyone is threatening or hurting you, please call immediately.

## 2022-05-13 NOTE — Assessment & Plan Note (Signed)
Well-controlled. Continue Lisinopril 10mg  daily. Check CMP today.

## 2022-05-13 NOTE — Assessment & Plan Note (Signed)
Last A1c 5.7% on 02/21/21. Check updated A1c

## 2022-05-13 NOTE — Assessment & Plan Note (Signed)
40 pack year smoking history. Currently smoking 1PPD and not ready to quit. Briefly counseled. Encouraged patient to let us know when he's ready and we can provide assistance.

## 2022-05-13 NOTE — Assessment & Plan Note (Signed)
Started on rosuvastatin 10mg  1 year ago due to ASCVD risk 17.1% (male, age 56, white, total cholesterol 179, HDL 25, systolic BP 120 medicated, smoker). Will check repeat lipid panel today.

## 2022-05-13 NOTE — Progress Notes (Signed)
    SUBJECTIVE:   Chief compliant/HPI: annual examination  Robert Lambert is a 56 y.o. who presents today for an annual exam.   Current concerns: none  History tabs reviewed and updated.    OBJECTIVE:   BP 129/88   Pulse 83   Wt 201 lb 9.6 oz (91.4 kg)   SpO2 99%   BMI 28.93 kg/m   Gen: NAD, pleasant, able to participate in exam HEENT: [place]/AT, PERRLA, nares patent bilaterally, TM normal bilaterally, oropharynx unremarkable Neck: supple, no cervical or supraclavicular lymphadenopathy CV: RRR, normal S1/S2, no murmur Resp: Normal effort, lungs CTAB GI: abdomen soft, non-tender, non-distended Extremities: no edema or cyanosis Skin: warm and dry, no rashes noted Neuro: alert, no obvious focal deficits Psych: Normal affect and mood   ASSESSMENT/PLAN:   Essential hypertension Well-controlled. Continue Lisinopril 10mg  daily. Check CMP today.  Dyslipidemia Started on rosuvastatin 10mg  1 year ago due to ASCVD risk 17.1% (male, age 31, white, total cholesterol 179, HDL 25, systolic BP 120 medicated, smoker). Will check repeat lipid panel today.   Prediabetes Last A1c 5.7% on 02/21/21. Check updated A1c  Tobacco use disorder 40 pack year smoking history. Currently smoking 1PPD and not ready to quit. Briefly counseled. Encouraged patient to let 53 know when he's ready and we can provide assistance.    Annual Examination  See AVS for age appropriate recommendations.  PHQ score 0, reviewed. Blood pressure value is at goal, discussed as above.   Considered the following screening exams based upon USPSTF recommendations: Diabetes screening: ordered- see prediabetes above Screening for elevated cholesterol: ordered- see dyslipidemia above HIV testing:  negative in 2022- repeat not indicated Hepatitis C:  negative in 2022- repeat not indicated Syphilis if at high risk:  not at high risk Reviewed risk factors for latent tuberculosis and not indicated Colorectal cancer  screening:  referral to GI placed previously for colonoscopy--patient to call [facility] to schedule Lung cancer screening:  ordered  See documentation above regarding discussion and indication.  PSA discussed and after engaging in discussion of possible risks, benefits and complications of screening patient elected to proceed with PSA.  Vaccinations: given printed Rx for shingles vaccine. Otherwise UTD.  Follow up in 1 year or sooner if indicated.    2023, MD [facility]

## 2022-05-14 ENCOUNTER — Other Ambulatory Visit: Payer: 59

## 2022-05-14 DIAGNOSIS — H52223 Regular astigmatism, bilateral: Secondary | ICD-10-CM | POA: Diagnosis not present

## 2022-05-14 DIAGNOSIS — E785 Hyperlipidemia, unspecified: Secondary | ICD-10-CM | POA: Diagnosis not present

## 2022-05-14 DIAGNOSIS — R7303 Prediabetes: Secondary | ICD-10-CM

## 2022-05-14 DIAGNOSIS — I10 Essential (primary) hypertension: Secondary | ICD-10-CM

## 2022-05-14 DIAGNOSIS — Z125 Encounter for screening for malignant neoplasm of prostate: Secondary | ICD-10-CM

## 2022-05-15 LAB — LIPID PANEL
Chol/HDL Ratio: 5.2 ratio — ABNORMAL HIGH (ref 0.0–5.0)
Cholesterol, Total: 146 mg/dL (ref 100–199)
HDL: 28 mg/dL — ABNORMAL LOW (ref >39)
LDL Chol Calc (NIH): 59 mg/dL (ref 0–99)
Triglycerides: 385 mg/dL — ABNORMAL HIGH (ref 0–149)
VLDL Cholesterol Cal: 59 mg/dL — ABNORMAL HIGH (ref 5–40)

## 2022-05-15 LAB — COMPREHENSIVE METABOLIC PANEL
ALT: 24 IU/L (ref 0–44)
AST: 21 IU/L (ref 0–40)
Albumin/Globulin Ratio: 2.3 — ABNORMAL HIGH (ref 1.2–2.2)
Albumin: 4.5 g/dL (ref 3.8–4.9)
Alkaline Phosphatase: 66 IU/L (ref 44–121)
BUN/Creatinine Ratio: 15 (ref 9–20)
BUN: 16 mg/dL (ref 6–24)
Bilirubin Total: 0.3 mg/dL (ref 0.0–1.2)
CO2: 25 mmol/L (ref 20–29)
Calcium: 9.7 mg/dL (ref 8.7–10.2)
Chloride: 104 mmol/L (ref 96–106)
Creatinine, Ser: 1.06 mg/dL (ref 0.76–1.27)
Globulin, Total: 2 g/dL (ref 1.5–4.5)
Glucose: 101 mg/dL — ABNORMAL HIGH (ref 70–99)
Potassium: 4.2 mmol/L (ref 3.5–5.2)
Sodium: 144 mmol/L (ref 134–144)
Total Protein: 6.5 g/dL (ref 6.0–8.5)
eGFR: 82 mL/min/{1.73_m2} (ref >59)

## 2022-05-15 LAB — PSA: Prostate Specific Ag, Serum: 1.2 ng/mL (ref 0.0–4.0)

## 2022-05-15 LAB — HEMOGLOBIN A1C
Est. average glucose Bld gHb Est-mCnc: 114 mg/dL
Hgb A1c MFr Bld: 5.6 % (ref 4.8–5.6)

## 2022-05-28 ENCOUNTER — Ambulatory Visit (HOSPITAL_COMMUNITY): Payer: 59

## 2022-06-10 ENCOUNTER — Ambulatory Visit (HOSPITAL_COMMUNITY)
Admission: RE | Admit: 2022-06-10 | Discharge: 2022-06-10 | Disposition: A | Discharge status: Home / Self Care | Payer: 59 | Source: Ambulatory Visit | Attending: Family Medicine | Admitting: Family Medicine

## 2022-06-10 DIAGNOSIS — F17200 Nicotine dependence, unspecified, uncomplicated: Secondary | ICD-10-CM | POA: Insufficient documentation

## 2022-06-10 DIAGNOSIS — Z122 Encounter for screening for malignant neoplasm of respiratory organs: Secondary | ICD-10-CM | POA: Diagnosis not present

## 2022-06-10 DIAGNOSIS — F17210 Nicotine dependence, cigarettes, uncomplicated: Secondary | ICD-10-CM | POA: Diagnosis not present

## 2022-06-19 ENCOUNTER — Other Ambulatory Visit (HOSPITAL_COMMUNITY): Payer: Self-pay

## 2022-06-19 ENCOUNTER — Telehealth: Payer: Self-pay | Admitting: Family Medicine

## 2022-06-19 DIAGNOSIS — E785 Hyperlipidemia, unspecified: Secondary | ICD-10-CM

## 2022-06-19 MED ORDER — ROSUVASTATIN CALCIUM 20 MG PO TABS
20.0000 mg | ORAL_TABLET | Freq: Every day | ORAL | 0 refills | Status: DC
  Filled 2022-06-19: qty 90, 90d supply, fill #0

## 2022-06-19 MED ORDER — ICOSAPENT ETHYL 1 G PO CAPS
2.0000 g | ORAL_CAPSULE | Freq: Two times a day (BID) | ORAL | 2 refills | Status: DC
  Filled 2022-06-19: qty 120, 30d supply, fill #0
  Filled 2022-08-20: qty 120, 30d supply, fill #1
  Filled 2022-10-21: qty 120, 30d supply, fill #2

## 2022-06-19 NOTE — Telephone Encounter (Signed)
Discussed lab results with patient via telephone. CMP, PSA, and A1c within normal range. Lipid panel shows hypertriglyceridemia-- recommended starting Vascepa and patient is agreeable. Rx sent. Will also increase Crestor to 20mg  daily. Emphasized importance of lifestyle modifications. Unfortunately patient not interested in smoking cessation.  Recheck lipid panel in 6 months. Consider referral to cardiology/lipid clinic if persistent dyslipidemia at that time.  Alcus Dad, MD PGY-3, [place]

## 2022-07-21 IMAGING — CT CT CHEST LUNG CANCER SCREENING LOW DOSE W/O CM
1 of 2 series · 10 of 20 positions shown, 13 images · non-contrast
Comparison: None.

CLINICAL DATA: 55-year-old male with 40 pack-year history of
smoking. Lung cancer screening.

EXAM:
CT CHEST WITHOUT CONTRAST LOW-DOSE FOR LUNG CANCER SCREENING
TECHNIQUE: Multidetector CT imaging of the chest was performed following the
standard protocol without IV contrast.

[ct lung segmentation data · axial · 0.72mm/px · z∈[-288,-288]mm · 10 of 327 frames shown]
[frame 1/327  mediastinal]
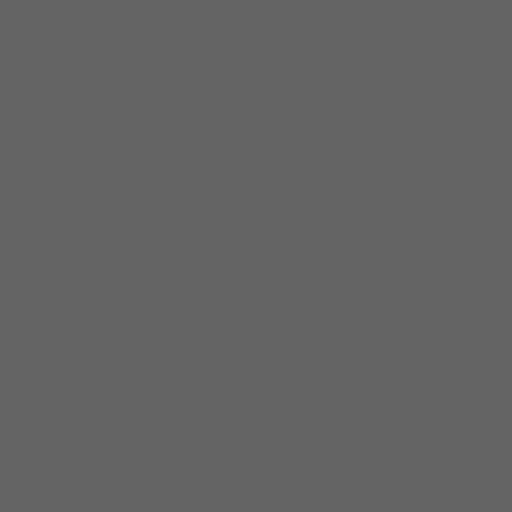
[frame 1/327  lung]
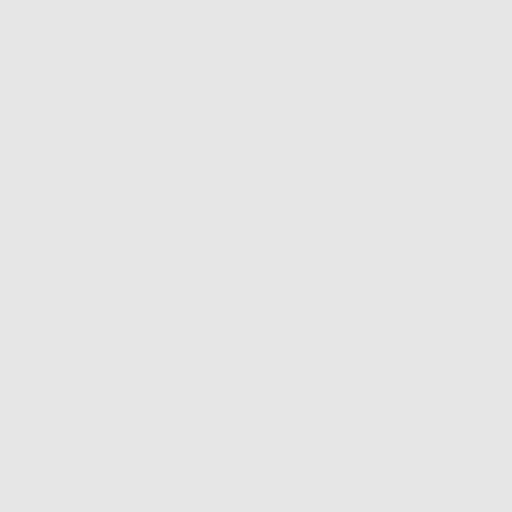
[frame 37/327  lung]
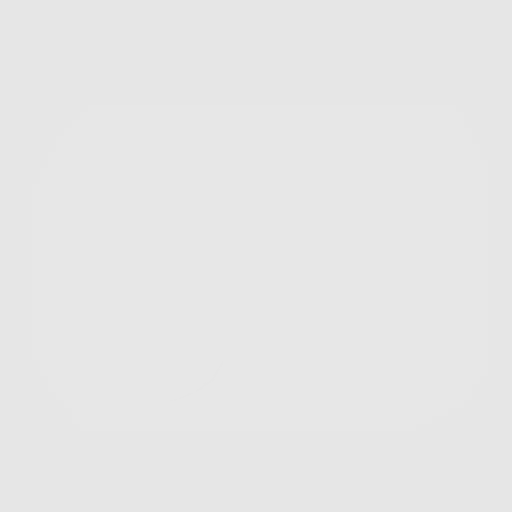
[frame 73/327  lung]
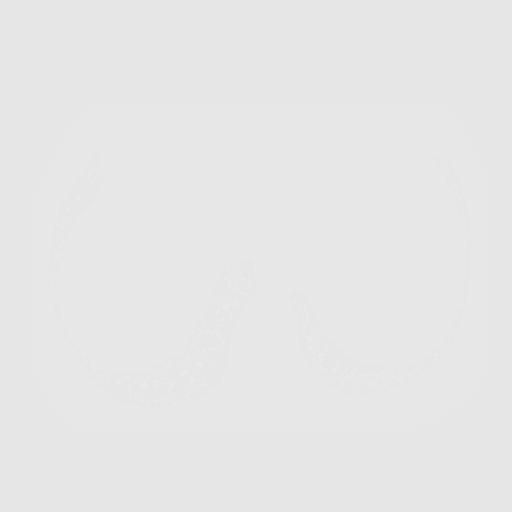
[frame 109/327  lung]
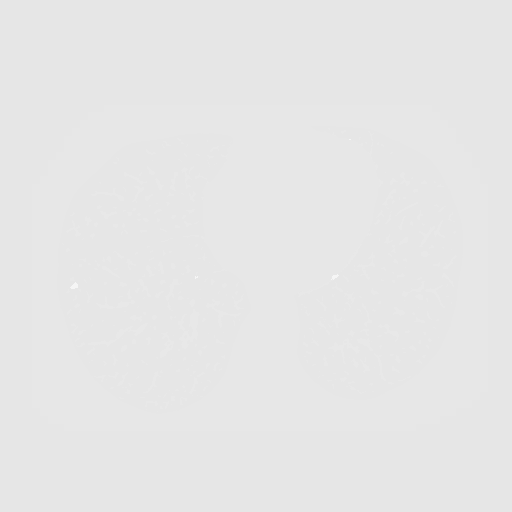
[frame 145/327  mediastinal]
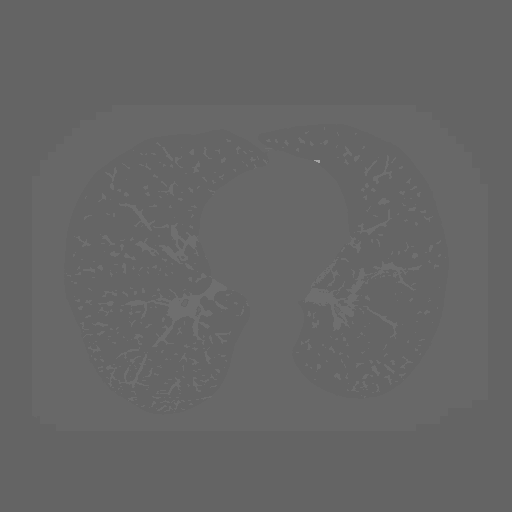
[frame 145/327  lung]
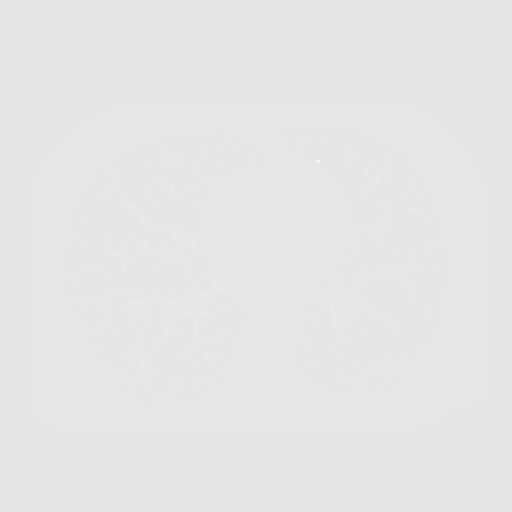
[frame 182/327  lung]
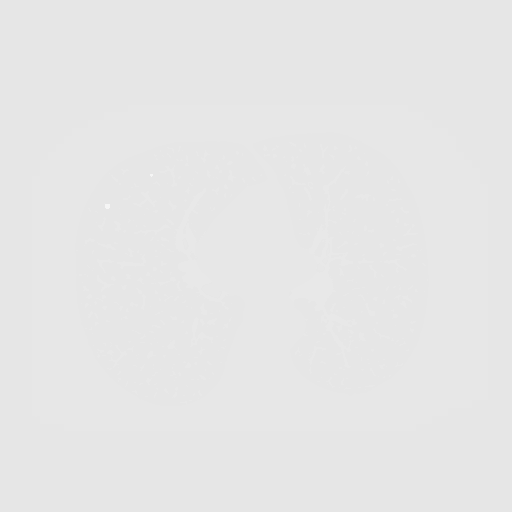
[frame 218/327  lung]
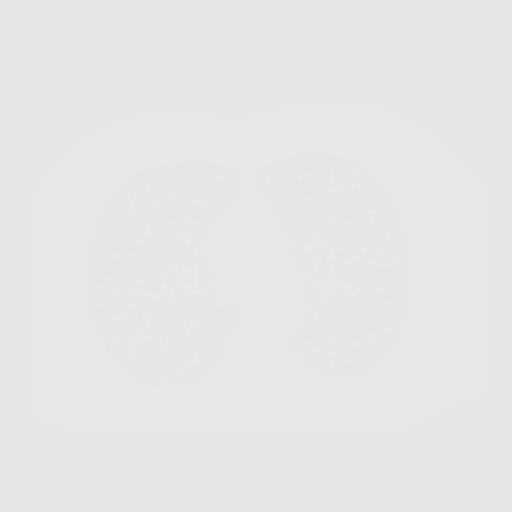
[frame 254/327  lung]
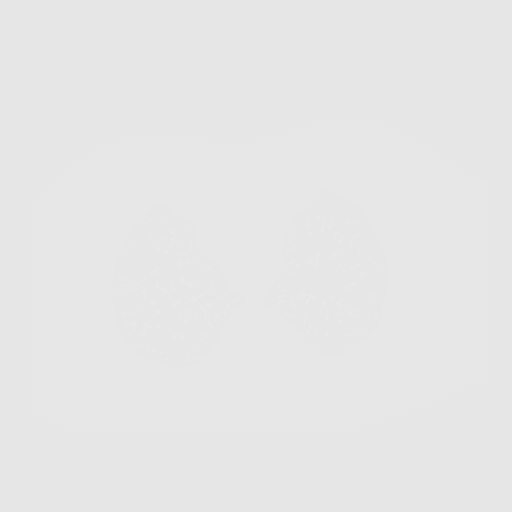
[frame 290/327  mediastinal]
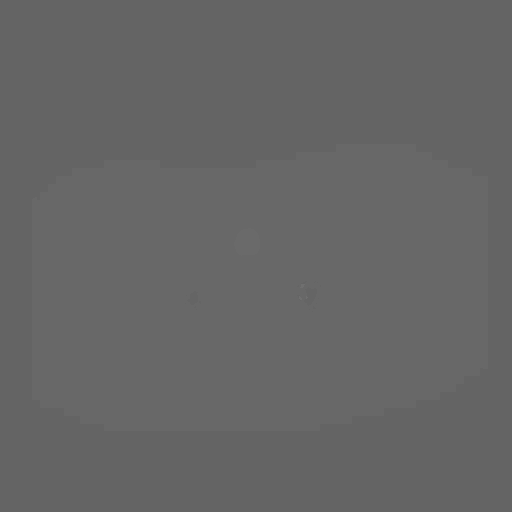
[frame 290/327  lung]
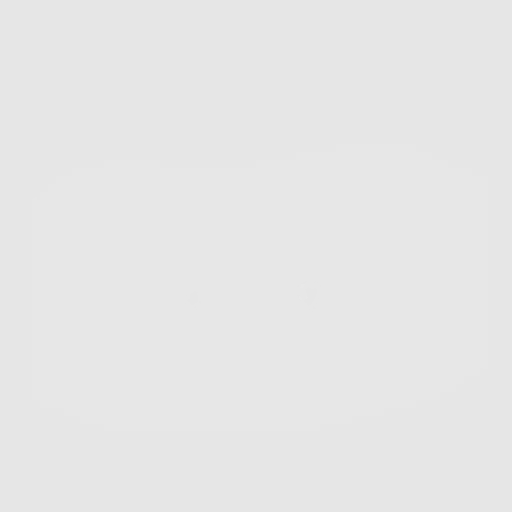
[frame 327/327  lung]
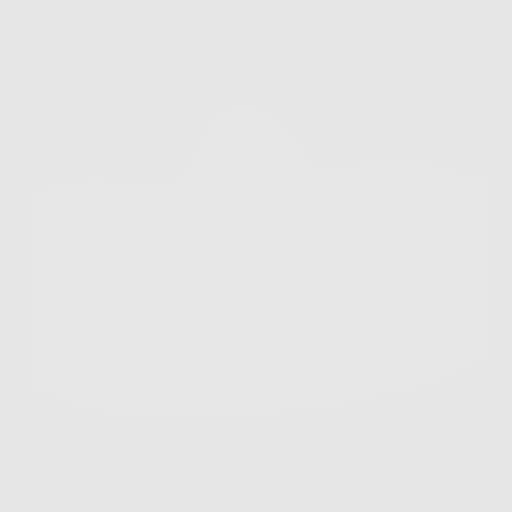

[10 of 20 positions shown; findings below may reference images not displayed]

FINDINGS: Cardiovascular: The heart size is normal. No substantial pericardial
effusion. Coronary artery calcification is evident. Mild
atherosclerotic calcification is noted in the wall of the thoracic
aorta.

Mediastinum/Nodes: No mediastinal lymphadenopathy. No evidence for
gross hilar lymphadenopathy although assessment is limited by the
lack of intravenous contrast on today's study. The esophagus has
normal imaging features. There is no axillary lymphadenopathy.

Lungs/Pleura: Centrilobular emphsyema noted. Scattered bilateral
pulmonary nodules are identified ranging in size from 3.2 up to
mm. Dominant nodules are in the peripheral left lower lobe including
image 198. No focal airspace consolidation. No pleural effusion.

Upper Abdomen: Unremarkable.

Musculoskeletal: No worrisome lytic or sclerotic osseous
abnormality.
IMPRESSION: 1. Lung-RADS 2, benign appearance or behavior. Continue annual
screening with low-dose chest CT without contrast in 12 months.
2. Some of the nodules in the left lower lobe are clustered raising
the question of atypical infection, including BROUWER.
3.  Emphysema (MDIBD-W2W.J) and Aortic Atherosclerosis (MDIBD-170.0)

## 2022-08-12 ENCOUNTER — Other Ambulatory Visit: Payer: Self-pay | Admitting: Family Medicine

## 2022-08-12 ENCOUNTER — Other Ambulatory Visit (HOSPITAL_COMMUNITY): Payer: Self-pay

## 2022-08-12 DIAGNOSIS — I10 Essential (primary) hypertension: Secondary | ICD-10-CM

## 2022-08-12 MED ORDER — LISINOPRIL 10 MG PO TABS
10.0000 mg | ORAL_TABLET | Freq: Every day | ORAL | 0 refills | Status: DC
  Filled 2022-08-12: qty 90, 90d supply, fill #0

## 2022-08-20 ENCOUNTER — Other Ambulatory Visit (HOSPITAL_COMMUNITY): Payer: Self-pay

## 2022-10-21 ENCOUNTER — Other Ambulatory Visit: Payer: Self-pay

## 2022-10-21 ENCOUNTER — Other Ambulatory Visit (HOSPITAL_COMMUNITY): Payer: Self-pay

## 2022-10-21 ENCOUNTER — Other Ambulatory Visit: Payer: Self-pay | Admitting: Family Medicine

## 2022-10-21 DIAGNOSIS — E785 Hyperlipidemia, unspecified: Secondary | ICD-10-CM

## 2022-10-21 DIAGNOSIS — I10 Essential (primary) hypertension: Secondary | ICD-10-CM

## 2022-10-22 ENCOUNTER — Other Ambulatory Visit (HOSPITAL_COMMUNITY): Payer: Self-pay

## 2022-10-22 MED ORDER — LISINOPRIL 10 MG PO TABS
10.0000 mg | ORAL_TABLET | Freq: Every day | ORAL | 0 refills | Status: DC
  Filled 2022-10-22: qty 90, 90d supply, fill #0

## 2022-10-22 MED ORDER — ROSUVASTATIN CALCIUM 20 MG PO TABS
20.0000 mg | ORAL_TABLET | Freq: Every day | ORAL | 0 refills | Status: DC
  Filled 2022-10-22: qty 90, 90d supply, fill #0

## 2022-11-25 ENCOUNTER — Other Ambulatory Visit: Payer: Self-pay | Admitting: Family Medicine

## 2022-11-25 ENCOUNTER — Other Ambulatory Visit (HOSPITAL_COMMUNITY): Payer: Self-pay

## 2022-11-25 MED ORDER — ICOSAPENT ETHYL 1 G PO CAPS
2.0000 g | ORAL_CAPSULE | Freq: Two times a day (BID) | ORAL | 2 refills | Status: DC
  Filled 2022-11-25: qty 120, 30d supply, fill #0
  Filled 2023-02-06: qty 120, 30d supply, fill #1
  Filled 2023-05-16: qty 120, 30d supply, fill #2

## 2023-01-15 DIAGNOSIS — M1000 Idiopathic gout, unspecified site: Secondary | ICD-10-CM | POA: Diagnosis not present

## 2023-01-22 ENCOUNTER — Ambulatory Visit (HOSPITAL_COMMUNITY)
Admission: EM | Admit: 2023-01-22 | Discharge: 2023-01-22 | Disposition: A | Discharge status: Home / Self Care | Payer: 59 | Attending: Emergency Medicine | Admitting: Emergency Medicine

## 2023-01-22 ENCOUNTER — Encounter (HOSPITAL_COMMUNITY): Payer: Self-pay

## 2023-01-22 DIAGNOSIS — M109 Gout, unspecified: Secondary | ICD-10-CM

## 2023-01-22 MED ORDER — COLCHICINE 0.6 MG PO TABS: ORAL_TABLET | ORAL | 0 refills | Status: DC

## 2023-01-22 MED ORDER — METHYLPREDNISOLONE SODIUM SUCC 125 MG IJ SOLR
INTRAMUSCULAR | Status: AC
  Filled 2023-01-22: qty 2

## 2023-01-22 MED ORDER — NAPROXEN 500 MG PO TABS: 500.0000 mg | ORAL_TABLET | Freq: Two times a day (BID) | ORAL | 0 refills | Status: DC

## 2023-01-22 MED ORDER — METHYLPREDNISOLONE SODIUM SUCC 125 MG IJ SOLR
60.0000 mg | Freq: Once | INTRAMUSCULAR | Status: AC
  Administered 2023-01-22: 60 mg via INTRAMUSCULAR

## 2023-01-22 NOTE — Discharge Instructions (Addendum)
Please take the colchicine as ordered on the bottle.  If you experience no improvement over the next few days, please stop the colchicine and switch to naproxen.  Sometimes the colchicine is not as effective if not started right away at the beginning of a gout attack.  I suggest taking these medications with a snack or a meal.  We have also given you a steroid injection in clinic today.  Please avoid foods high in purines, I have attached a dietary guideline for low purine foods.  Please follow-up with your primary care provider sometime next week for further evaluation.

## 2023-01-22 NOTE — ED Provider Notes (Signed)
[facility]    CSN: [id number] Arrival date & time: 01/22/23  1240      History   Chief Complaint No chief complaint on file.   HPI Robert Lambert is a 57 y.o. male.   Patient reports a gout flareup in his left great toe.  This has been ongoing for the past week and a half.  He did drink alcohol over the weekend.  He was seen at an urgent care on April 17, given a Toradol shot and sent home on 5 days of prednisone.  This helped but did not fully resolve his pain.  He went to work and has been working overtime, feels like that the pressure on his feet has caused an increase of his flare.  He has also been eating red meat.  He has taken allopurinol in the past, is no longer on this.    The history is provided by the patient and medical records.    Past Medical History:  Diagnosis Date   Hyperlipidemia    Hypertension    Prediabetes    Tobacco use disorder     Patient Active Problem List   Diagnosis Date Noted   Prediabetes 05/11/2021   Dyslipidemia 05/11/2021   Alcohol intake above recommended sensible limits without complication 02/21/2021   Essential hypertension 04/20/2018   Tobacco use disorder 04/15/2018    History reviewed. No pertinent surgical history.     Home Medications    Prior to Admission medications   Medication Sig Start Date End Date Taking? Authorizing Provider  colchicine 0.6 MG tablet Start with 1.2 mg or two tablets of colchicine, an hour later take another 0.6 mg tablet. Then take one 0.6 mg tablet twice daily until 48 hours after symptom resolution. 01/22/23  Yes Rinaldo Ratel, [place], FNP  icosapent Ethyl (VASCEPA) 1 g capsule Take 2 capsules (2 g total) by mouth 2 (two) times daily. 11/25/22  Yes Maury Dus, MD  lisinopril (ZESTRIL) 10 MG tablet Take 1 tablet (10 mg total) by mouth daily. 10/22/22  Yes Maury Dus, MD  naproxen (NAPROSYN) 500 MG tablet Take 1 tablet (500 mg total) by mouth 2 (two) times daily. 01/22/23  Yes  Rinaldo Ratel, [place], FNP  rosuvastatin (CRESTOR) 20 MG tablet Take 1 tablet (20 mg total) by mouth daily. 10/22/22  Yes Maury Dus, MD    Family History History reviewed. No pertinent family history.  Social History Social History   Tobacco Use   Smoking status: Every Day    Packs/day: 1.00    Years: 40.00    Additional pack years: 0.00    Total pack years: 40.00    Types: Cigarettes   Smokeless tobacco: Never  Substance Use Topics   Alcohol use: Yes    Comment: ~4 drinks several days per week   Drug use: Never     Allergies   Patient has no known allergies.   Review of Systems Review of Systems  Constitutional:  Negative for chills, fatigue and fever.  Respiratory:  Negative for cough and shortness of breath.   Cardiovascular:  Negative for chest pain.  Musculoskeletal:  Positive for joint swelling.     Physical Exam Triage Vital Signs ED Triage Vitals [01/22/23 1422]  Enc Vitals Group     BP (!) 148/61     Pulse      Resp 18     Temp 98.1 F (36.7 C)     Temp Source Oral     SpO2 98 %  Weight      Height      Head Circumference      Peak Flow      Pain Score      Pain Loc      Pain Edu?      Excl. in GC?    No data found.  Updated Vital Signs BP (!) 148/61 (BP Location: Left Arm)   Temp 98.1 F (36.7 C) (Oral)   Resp 18   SpO2 98%   Visual Acuity Right Eye Distance:   Left Eye Distance:   Bilateral Distance:    Right Eye Near:   Left Eye Near:    Bilateral Near:     Physical Exam Vitals and nursing note reviewed.  Constitutional:      Appearance: Normal appearance.  HENT:     Head: Normocephalic and atraumatic.     Right Ear: External ear normal.     Left Ear: External ear normal.     Nose: Nose normal.     Mouth/Throat:     Mouth: Mucous membranes are moist.  Eyes:     Conjunctiva/sclera: Conjunctivae normal.  Cardiovascular:     Rate and Rhythm: Normal rate and regular rhythm.  Pulmonary:     Effort: Pulmonary  effort is normal. No respiratory distress.  Musculoskeletal:        General: Tenderness present. No swelling, deformity or signs of injury. Normal range of motion.       Feet:  Feet:     Left foot:     Skin integrity: Erythema and warmth present.     Comments: Warmth and erythema at left great toe, tender to palpation.  Skin:    General: Skin is warm and dry.     Capillary Refill: Capillary refill takes less than 2 seconds.     Findings: Erythema present.  Neurological:     General: No focal deficit present.     Mental Status: He is alert and oriented to person, place, and time.  Psychiatric:        Mood and Affect: Mood normal.        Behavior: Behavior normal. Behavior is cooperative.      UC Treatments / Results  Labs (all labs ordered are listed, but only abnormal results are displayed) Labs Reviewed - No data to display  EKG   Radiology No results found.  Procedures Procedures (including critical care time)  Medications Ordered in UC Medications  methylPREDNISolone sodium succinate (SOLU-MEDROL) 125 mg/2 mL injection 60 mg (60 mg Intramuscular Given 01/22/23 1437)    Initial Impression / Assessment and Plan / UC Course  I have reviewed the triage vital signs and the nursing notes.  Pertinent labs & imaging results that were available during my care of the patient were reviewed by me and considered in my medical decision making (see chart for details).  Vitals and triage reviewed, patient is hemodynamically stable.  Left great toe with erythema, warmth and tenderness to palpation.  Suspect gout flare with history as well as recent alcohol and red meat intake. No obvious lesions or skin breakdown to indicate cellulitis or infection. Recently treated with 5-day prednisone, given IM Solu-Medrol in clinic.  Sent home on colchicine, if this is not effective within the next 3 days advised to switch to naproxen.  Encouraged to follow-up with primary care, given information  for low purine diet.  Patient verbalized understanding, no questions at this time.     Final Clinical Impressions(s) / UC  Diagnoses   Final diagnoses:  Acute gout involving toe of left foot, unspecified cause     Discharge Instructions      Please take the colchicine as ordered on the bottle.  If you experience no improvement over the next few days, please stop the colchicine and switch to naproxen.  Sometimes the colchicine is not as effective if not started right away at the beginning of a gout attack.  I suggest taking these medications with a snack or a meal.  We have also given you a steroid injection in clinic today.  Please avoid foods high in purines, I have attached a dietary guideline for low purine foods.  Please follow-up with your primary care provider sometime next week for further evaluation.      ED Prescriptions     Medication Sig Dispense Auth. Provider   colchicine 0.6 MG tablet Start with 1.2 mg or two tablets of colchicine, an hour later take another 0.6 mg tablet. Then take one 0.6 mg tablet twice daily until 48 hours after symptom resolution. 30 tablet Rinaldo Ratel, [place] N, [place]   naproxen (NAPROSYN) 500 MG tablet Take 1 tablet (500 mg total) by mouth 2 (two) times daily. 30 tablet Mireya Meditz, [place] N, [place]      PDMP not reviewed this encounter.   Etoile Looman, [place] N, [place] 01/22/23 1530

## 2023-01-22 NOTE — ED Triage Notes (Signed)
Pt reports left foot pain for several days. Pt stated he has a history of Gout. Denies any injuries or falls.

## 2023-02-06 ENCOUNTER — Other Ambulatory Visit: Payer: Self-pay

## 2023-02-06 ENCOUNTER — Other Ambulatory Visit (HOSPITAL_COMMUNITY): Payer: Self-pay

## 2023-05-16 ENCOUNTER — Other Ambulatory Visit: Payer: Self-pay

## 2023-05-21 ENCOUNTER — Other Ambulatory Visit (HOSPITAL_COMMUNITY): Payer: Self-pay

## 2023-07-16 ENCOUNTER — Other Ambulatory Visit (HOSPITAL_COMMUNITY): Payer: Self-pay

## 2024-04-21 ENCOUNTER — Other Ambulatory Visit (HOSPITAL_COMMUNITY): Payer: Self-pay

## 2024-04-21 ENCOUNTER — Ambulatory Visit (INDEPENDENT_AMBULATORY_CARE_PROVIDER_SITE_OTHER): Payer: Self-pay | Admitting: Family Medicine

## 2024-04-21 ENCOUNTER — Encounter: Payer: Self-pay | Admitting: Family Medicine

## 2024-04-21 VITALS — BP 147/68 | HR 85 | Temp 98.3°F | Ht 70.0 in | Wt 194.2 lb

## 2024-04-21 DIAGNOSIS — E785 Hyperlipidemia, unspecified: Secondary | ICD-10-CM

## 2024-04-21 DIAGNOSIS — I700 Atherosclerosis of aorta: Secondary | ICD-10-CM | POA: Diagnosis not present

## 2024-04-21 DIAGNOSIS — Z0001 Encounter for general adult medical examination with abnormal findings: Secondary | ICD-10-CM

## 2024-04-21 DIAGNOSIS — Z122 Encounter for screening for malignant neoplasm of respiratory organs: Secondary | ICD-10-CM

## 2024-04-21 DIAGNOSIS — I10 Essential (primary) hypertension: Secondary | ICD-10-CM | POA: Diagnosis not present

## 2024-04-21 DIAGNOSIS — R7303 Prediabetes: Secondary | ICD-10-CM

## 2024-04-21 DIAGNOSIS — I2510 Atherosclerotic heart disease of native coronary artery without angina pectoris: Secondary | ICD-10-CM

## 2024-04-21 DIAGNOSIS — F17200 Nicotine dependence, unspecified, uncomplicated: Secondary | ICD-10-CM | POA: Diagnosis not present

## 2024-04-21 DIAGNOSIS — K625 Hemorrhage of anus and rectum: Secondary | ICD-10-CM | POA: Diagnosis not present

## 2024-04-21 DIAGNOSIS — R351 Nocturia: Secondary | ICD-10-CM | POA: Diagnosis not present

## 2024-04-21 DIAGNOSIS — Z0000 Encounter for general adult medical examination without abnormal findings: Secondary | ICD-10-CM

## 2024-04-21 DIAGNOSIS — F1090 Alcohol use, unspecified, uncomplicated: Secondary | ICD-10-CM | POA: Diagnosis not present

## 2024-04-21 DIAGNOSIS — Z87891 Personal history of nicotine dependence: Secondary | ICD-10-CM | POA: Diagnosis not present

## 2024-04-21 DIAGNOSIS — Z1211 Encounter for screening for malignant neoplasm of colon: Secondary | ICD-10-CM

## 2024-04-21 MED ORDER — LISINOPRIL 10 MG PO TABS
10.0000 mg | ORAL_TABLET | Freq: Every day | ORAL | 1 refills | Status: AC
  Filled 2024-04-21: qty 90, 90d supply, fill #0
  Filled 2024-07-28: qty 90, 90d supply, fill #1

## 2024-04-21 MED ORDER — ROSUVASTATIN CALCIUM 20 MG PO TABS
20.0000 mg | ORAL_TABLET | Freq: Every day | ORAL | 1 refills | Status: AC
Start: 2024-04-21 — End: ?
  Filled 2024-04-21: qty 90, 90d supply, fill #0
  Filled 2024-07-28: qty 90, 90d supply, fill #1

## 2024-04-21 MED ORDER — ICOSAPENT ETHYL 1 G PO CAPS
2.0000 g | ORAL_CAPSULE | Freq: Two times a day (BID) | ORAL | 2 refills | Status: AC
  Filled 2024-04-21: qty 120, 30d supply, fill #0
  Filled 2024-07-14: qty 120, 30d supply, fill #1
  Filled 2024-09-29: qty 120, 30d supply, fill #2

## 2024-04-21 NOTE — Patient Instructions (Signed)
 Goal BP:  For patients younger than 60: Goal BP < 140/90. For patients 60 and older: Goal BP < 150/90. For patients with diabetes: Goal BP < 140/90.  Take your medications faithfully as prescribed. Maintain a healthy weight. Get at least 150 minutes of aerobic exercise per week. Minimize salt intake, less than 2000 mg per day. Minimize alcohol intake.  DASH Eating Plan DASH stands for "Dietary Approaches to Stop Hypertension." The DASH eating plan is a healthy eating plan that has been shown to reduce high blood pressure (hypertension). Additional health benefits may include reducing the risk of type 2 diabetes mellitus, heart disease, and stroke. The DASH eating plan may also help with weight loss.  WHAT DO I NEED TO KNOW ABOUT THE DASH EATING PLAN? For the DASH eating plan, you will follow these general guidelines: Choose foods with a percent daily value for sodium of less than 5% (as listed on the food label). Use salt-free seasonings or herbs instead of table salt or sea salt. Check with your health care provider or pharmacist before using salt substitutes. Eat lower-sodium products, often labeled as "lower sodium" or "no salt added." Eat fresh foods. Eat more vegetables, fruits, and low-fat dairy products. Choose whole grains. Look for the word "whole" as the first word in the ingredient list. Choose fish and skinless chicken or [place] more often than red meat. Limit fish, poultry, and meat to 6 oz (170 g) each day. Limit sweets, desserts, sugars, and sugary drinks. Choose heart-healthy fats. Limit cheese to 1 oz (28 g) per day. Eat more home-cooked food and less restaurant, buffet, and fast food. Limit fried foods. Cook foods using methods other than frying. Limit canned vegetables. If you do use them, rinse them well to decrease the sodium. When eating at a restaurant, ask that your food be prepared with less salt, or no salt if possible.  WHAT FOODS CAN I EAT? Seek help from  a dietitian for individual calorie needs.  Grains Whole grain or whole wheat bread. Brown rice. Whole grain or whole wheat pasta. Quinoa, bulgur, and whole grain cereals. Low-sodium cereals. Corn or whole wheat flour tortillas. Whole grain cornbread. Whole grain crackers. Low-sodium crackers.  Vegetables Fresh or frozen vegetables (raw, steamed, roasted, or grilled). Low-sodium or reduced-sodium tomato and vegetable juices. Low-sodium or reduced-sodium tomato sauce and paste. Low-sodium or reduced-sodium canned vegetables.   Fruits All fresh, canned (in natural juice), or frozen fruits.  Meat and Other Protein Products Ground beef (85% or leaner), grass-fed beef, or beef trimmed of fat. Skinless chicken or [place]. Ground chicken or [place]. Pork trimmed of fat. All fish and seafood. Eggs. Dried beans, peas, or lentils. Unsalted nuts and seeds. Unsalted canned beans.  Dairy Low-fat dairy products, such as skim or 1% milk, 2% or reduced-fat cheeses, low-fat ricotta or cottage cheese, or plain low-fat yogurt. Low-sodium or reduced-sodium cheeses.  Fats and Oils Tub margarines without trans fats. Light or reduced-fat mayonnaise and salad dressings (reduced sodium). Avocado. Safflower, olive, or canola oils. Natural peanut or almond butter.  Other Unsalted popcorn and pretzels. The items listed above may not be a complete list of recommended foods or beverages. Contact your dietitian for more options.  WHAT FOODS ARE NOT RECOMMENDED?  Grains White bread. White pasta. White rice. Refined cornbread. Bagels and croissants. Crackers that contain trans fat.  Vegetables Creamed or fried vegetables. Vegetables in a cheese sauce. Regular canned vegetables. Regular canned tomato sauce and paste. Regular tomato and vegetable juices.  Fruits Dried fruits. Canned fruit in light or heavy syrup. Fruit juice.  Meat and Other Protein Products Fatty cuts of meat. Ribs, chicken wings, bacon, sausage,  bologna, salami, chitterlings, fatback, hot dogs, bratwurst, and packaged luncheon meats. Salted nuts and seeds. Canned beans with salt.  Dairy Whole or 2% milk, cream, half-and-half, and cream cheese. Whole-fat or sweetened yogurt. Full-fat cheeses or blue cheese. Nondairy creamers and whipped toppings. Processed cheese, cheese spreads, or cheese curds.  Condiments Onion and garlic salt, seasoned salt, table salt, and sea salt. Canned and packaged gravies. Worcestershire sauce. Tartar sauce. Barbecue sauce. Teriyaki sauce. Soy sauce, including reduced sodium. Steak sauce. Fish sauce. Oyster sauce. Cocktail sauce. Horseradish. Ketchup and mustard. Meat flavorings and tenderizers. Bouillon cubes. Hot sauce. Tabasco sauce. Marinades. Taco seasonings. Relishes.  Fats and Oils Butter, stick margarine, lard, shortening, ghee, and bacon fat. Coconut, palm kernel, or palm oils. Regular salad dressings.  Other Pickles and olives. Salted popcorn and pretzels.  The items listed above may not be a complete list of foods and beverages to avoid. Contact your dietitian for more information.  WHERE CAN I FIND MORE INFORMATION? [employer], [employer], [employer]: [employer] Document Released: 09/05/2011 Document Revised: 01/31/2014 Document Reviewed: 07/21/2013 [facility] Patient Information 2015 [place], [place]. This information is not intended to replace advice given to you by your health care provider. Make sure you discuss any questions you have with your health care provider.   I think that you would greatly benefit from seeing a nutritionist.  If you are interested, please call Dr. Gerilyn Pilgrim at [phone] to schedule an appointment.

## 2024-04-21 NOTE — Progress Notes (Signed)
 Complete physical exam  Patient: Robert Lambert   DOB: July 20, 1966   58 y.o. Male  MRN: [id number]  Subjective:    Chief Complaint  Patient presents with   New Patient (Initial Visit)    [facility] - [facility] Dept Of [place]. [facility]    Establish Care    Robert Lambert is a 58 y.o. male who presents today for a complete physical exam. He reports consuming a general diet. The patient does not participate in regular exercise at present. He generally feels well. He reports sleeping well. He does have additional problems to discuss today.   He has a history of hypertension and hyperlipidemia and has been out of his medications, which included lisinopril , Crestor , and Vascepa , for some time. He does not recall any issues when he was taking them. A CT scan in 2023 noted aortic and coronary artery atherosclerosis.  He has a significant smoking history, smoking a pack a day since age 64. He has undergone lung cancer screenings twice but has not had a screening for abdominal aortic aneurysm. He consumes approximately six alcoholic drinks, five nights a week, for about 40 years and is not aware of any liver issues related to this.  There is a family history of diabetes affecting his grandfather, mother, and brother. His brother, who recently passed away, was on insulin. He has had elevated blood sugar levels in the past, with an A1c in the prediabetic range. No increased hunger, thirst, or urination.  He experiences occasional rectal bleeding. The bleeding is bright red and typically occurs with bowel movements, more pronounced when he drinks heavily. He had a normal colonoscopy over 15 years ago and has no family history of colorectal cancer. No significant changes in bowel habits, weight loss, night sweats, or loss of appetite.  He mentions recent situational stress due to the passing of his brother, affecting his mood and work attendance.  He reports occasional hearing  issues and acknowledges the need for an eye exam due to his high blood pressure.      Most recent fall risk assessment:    04/21/2024    3:48 PM  Fall Risk   Falls in the past year? 0  Risk for fall due to : No Fall Risks  Follow up Falls evaluation completed     Most recent depression screenings:    04/21/2024    3:49 PM 05/13/2022    4:49 PM  PHQ 2/9 Scores  PHQ - 2 Score 0 0  PHQ- 9 Score 0 0    Vision:Not within last year  and Dental: No current dental problems  Patient Active Problem List   Diagnosis Date Noted   Aortic atherosclerosis (HCC) 04/21/2024   Atherosclerosis of coronary artery 04/21/2024   Prediabetes 05/11/2021   Dyslipidemia 05/11/2021   Alcohol intake above recommended sensible limits without complication 02/21/2021   Essential hypertension 04/20/2018   Tobacco use disorder 04/15/2018   Past Medical History:  Diagnosis Date   Hyperlipidemia    Hypertension    Prediabetes    Tobacco use disorder    History reviewed. No pertinent surgical history. Social History   Tobacco Use   Smoking status: Every Day    Current packs/day: 1.00    Average packs/day: 1 pack/day for 85.6 years (85.6 ttl pk-yrs)    Types: Cigarettes    Start date: 1980   Smokeless tobacco: Never  Vaping Use   Vaping status: Never Used  Substance Use Topics   Alcohol use: Yes    Comment: drinks 6 drinks daily   Drug use: Never   Social History   Socioeconomic History   Marital status: Single    Spouse name: Not on file   Number of children: Not on file   Years of education: Not on file   Highest education level: Not on file  Occupational History   Not on file  Tobacco Use   Smoking status: Every Day    Current packs/day: 1.00    Average packs/day: 1 pack/day for 85.6 years (85.6 ttl pk-yrs)    Types: Cigarettes    Start date: 35   Smokeless tobacco: Never  Vaping Use   Vaping status: Never Used  Substance and Sexual Activity   Alcohol use: Yes    Comment:  drinks 6 drinks daily   Drug use: Never   Sexual activity: Yes  Other Topics Concern   Not on file  Social History Narrative   Not on file   Social Drivers of Health   Financial Resource Strain: Not on file  Food Insecurity: Not on file  Transportation Needs: Not on file  Physical Activity: Not on file  Stress: Not on file  Social Connections: Not on file  Intimate Partner Violence: Not on file   Family Status  Relation Name Status   Mother  Alive   Father  Deceased   Brother  Alive   Brother  Deceased   Brother  Deceased   MGM  Deceased   MGF  Deceased   PGM  Deceased   PGF  Deceased  No partnership data on file   Family History  Problem Relation Age of Onset   Hyperlipidemia Mother    Hypertension Mother    Diabetes Mother    Heart disease Mother    Hypertension Father    Heart failure Father    Diabetes Maternal Grandfather    No Known Allergies    Patient Care Team: [place], [place] HERO, FNP as PCP - General (Family Medicine)   Outpatient Medications Prior to Visit  Medication Sig   [DISCONTINUED] colchicine  0.6 MG tablet Start with 1.2 mg or two tablets of colchicine , an hour later take another 0.6 mg tablet. Then take one 0.6 mg tablet twice daily until 48 hours after symptom resolution.   [DISCONTINUED] icosapent  Ethyl (VASCEPA ) 1 g capsule Take 2 capsules (2 g total) by mouth 2 (two) times daily. (Patient not taking: Reported on 04/21/2024)   [DISCONTINUED] lisinopril  (ZESTRIL ) 10 MG tablet Take 1 tablet (10 mg total) by mouth daily. (Patient not taking: Reported on 04/21/2024)   [DISCONTINUED] naproxen  (NAPROSYN ) 500 MG tablet Take 1 tablet (500 mg total) by mouth 2 (two) times daily.   [DISCONTINUED] rosuvastatin  (CRESTOR ) 20 MG tablet Take 1 tablet (20 mg total) by mouth daily. (Patient not taking: Reported on 04/21/2024)   No facility-administered medications prior to visit.    ROS per HPI       Objective:     BP (!) 147/68   Pulse 85   Temp 98.3  F (36.8 C)   Ht 5' 10 (1.778 m)   Wt 194 lb 3.2 oz (88.1 kg)   SpO2 97%   BMI 27.86 kg/m  BP Readings from Last 3 Encounters:  04/21/24 (!) 147/68  01/22/23 (!) 148/61  05/13/22 129/88   Wt Readings from Last 3 Encounters:  04/21/24 194 lb 3.2 oz (88.1 kg)  05/13/22 201 lb 9.6 oz (91.4 kg)  05/11/21 199 lb (90.3 kg)   SpO2 Readings from Last 3 Encounters:  04/21/24 97%  01/22/23 98%  05/13/22 99%      Physical Exam Vitals and nursing note reviewed.  Constitutional:      General: He is not in acute distress.    Appearance: Normal appearance. He is obese. He is not ill-appearing, toxic-appearing or diaphoretic.  HENT:     Head: Normocephalic and atraumatic.     Right Ear: Hearing, tympanic membrane, ear canal and external ear normal.     Left Ear: Hearing, tympanic membrane, ear canal and external ear normal.     Nose: Nose normal.     Mouth/Throat:     Lips: Pink.     Mouth: Mucous membranes are moist.     Pharynx: Oropharynx is clear.  Eyes:     General: Lids are normal.     Extraocular Movements: Extraocular movements intact.     Conjunctiva/sclera: Conjunctivae normal.     Pupils: Pupils are equal, round, and reactive to light.  Neck:     Trachea: Trachea and phonation normal.  Cardiovascular:     Rate and Rhythm: Normal rate and regular rhythm.     Heart sounds: Normal heart sounds.  Pulmonary:     Effort: Pulmonary effort is normal.     Breath sounds: Normal breath sounds.  Abdominal:     General: There is distension.     Tenderness: There is no abdominal tenderness.  Musculoskeletal:     Cervical back: Neck supple.     Right lower leg: No edema.     Left lower leg: No edema.  Skin:    General: Skin is warm and dry.     Capillary Refill: Capillary refill takes less than 2 seconds.  Neurological:     General: No focal deficit present.     Mental Status: He is alert and oriented to person, place, and time.  Psychiatric:        Mood and Affect: Mood  normal.        Behavior: Behavior normal. Behavior is cooperative.        Thought Content: Thought content normal.        Judgment: Judgment normal.      Last CBC Lab Results  Component Value Date   WBC 6.0 12/18/2019   HGB 17.1 (H) 12/18/2019   HCT 52.6 (H) 12/18/2019   MCV 90.5 12/18/2019   MCH 29.4 12/18/2019   RDW 12.5 12/18/2019   PLT 196 12/18/2019   Last metabolic panel Lab Results  Component Value Date   GLUCOSE 101 (H) 05/14/2022   NA 144 05/14/2022   K 4.2 05/14/2022   CL 104 05/14/2022   CO2 25 05/14/2022   BUN 16 05/14/2022   CREATININE 1.06 05/14/2022   EGFR 82 05/14/2022   CALCIUM  9.7 05/14/2022   PROT 6.5 05/14/2022   ALBUMIN 4.5 05/14/2022   LABGLOB 2.0 05/14/2022   AGRATIO 2.3 (H) 05/14/2022   BILITOT 0.3 05/14/2022   ALKPHOS 66 05/14/2022   AST 21 05/14/2022   ALT 24 05/14/2022   ANIONGAP 11 12/18/2019   Last lipids Lab Results  Component Value Date   CHOL 146 05/14/2022   HDL 28 (L) 05/14/2022   LDLCALC 59 05/14/2022   TRIG 385 (H) 05/14/2022   CHOLHDL 5.2 (H) 05/14/2022   Last hemoglobin A1c Lab Results  Component Value Date   HGBA1C 5.6 05/14/2022      Assessment & Plan:    Routine  Health Maintenance and Physical Exam  Immunization History  Administered Date(s) Administered   Dtap, Unspecified 03/08/1969, 05/19/1972   Influenza Inj Mdck Quad With Preservative 06/09/2017   Influenza, Quadrivalent, Recombinant, Inj, Pf 07/04/2016   Influenza, Seasonal, Injecte, Preservative Fre 08/20/2014   Influenza,trivalent, recombinat, inj, PF 08/20/2014   Measles 12/18/1970   PFIZER Comirnaty(Gray Top)Covid-19 Tri-Sucrose Vaccine 05/31/2020, 06/21/2020   PNEUMOCOCCAL CONJUGATE-20 05/11/2021   Polio, Unspecified 05/13/1972   Rubella 12/18/1970   Tdap 08/20/2014    Health Maintenance  Topic Date Due   Hepatitis B Vaccines (1 of 3 - 19+ 3-dose series) Never done   Colonoscopy  Never done   Zoster Vaccines- Shingrix (1 of 2) Never done    COVID-19 Vaccine (3 - 2024-25 season) 06/01/2023   Lung Cancer Screening  06/11/2023   INFLUENZA VACCINE  04/30/2024   DTaP/Tdap/Td (4 - Td or Tdap) 08/20/2024   Pneumococcal Vaccine 42-77 Years old  Completed   Hepatitis C Screening  Completed   HIV Screening  Completed   HPV VACCINES  Aged Out   Meningococcal B Vaccine  Aged Out    Discussed health benefits of physical activity, and encouraged him to engage in regular exercise appropriate for his age and condition.  Problem List Items Addressed This Visit       Cardiovascular and Mediastinum   Essential hypertension   Relevant Medications   icosapent  Ethyl (VASCEPA ) 1 g capsule   lisinopril  (ZESTRIL ) 10 MG tablet   rosuvastatin  (CRESTOR ) 20 MG tablet   Other Relevant Orders   Anemia Profile B   CMP14+EGFR   Lipid panel   Thyroid  Panel With TSH   Aortic atherosclerosis (HCC)   Relevant Medications   icosapent  Ethyl (VASCEPA ) 1 g capsule   lisinopril  (ZESTRIL ) 10 MG tablet   rosuvastatin  (CRESTOR ) 20 MG tablet   Other Relevant Orders   CMP14+EGFR   Lipid panel   US  AORTA DUPLEX COMPLETE   Atherosclerosis of coronary artery   Relevant Medications   icosapent  Ethyl (VASCEPA ) 1 g capsule   lisinopril  (ZESTRIL ) 10 MG tablet   rosuvastatin  (CRESTOR ) 20 MG tablet   Other Relevant Orders   CMP14+EGFR   Lipid panel     Other   Alcohol intake above recommended sensible limits without complication   Relevant Orders   CMP14+EGFR   Prediabetes   Relevant Orders   Anemia Profile B   CMP14+EGFR   Lipid panel   Thyroid  Panel With TSH   Bayer DCA Hb A1c Waived   Dyslipidemia   Relevant Medications   icosapent  Ethyl (VASCEPA ) 1 g capsule   rosuvastatin  (CRESTOR ) 20 MG tablet   Other Relevant Orders   CMP14+EGFR   Lipid panel   Other Visit Diagnoses       Annual physical exam    -  Primary   Relevant Orders   Anemia Profile B   CMP14+EGFR   Lipid panel   Ambulatory Referral Lung Cancer Screening Short  Pulmonary   US  AORTA DUPLEX COMPLETE   Ambulatory referral to Gastroenterology     Encounter for screening for abdominal aortic aneurysm (AAA) in patient 84 years of age or older with history of smoking       Relevant Orders   US  AORTA DUPLEX COMPLETE     Screening for lung cancer         Screening for colorectal cancer       Relevant Orders   Ambulatory referral to Gastroenterology     Bright red rectal bleeding  Relevant Orders   Anemia Profile B   Ambulatory referral to Gastroenterology   Fecal occult blood, imunochemical     Nocturia       Relevant Orders   CMP14+EGFR   PSA, total and free         Tobacco Use Disorder He has a long-term smoking history, approximately one pack per day since age 59, significantly increasing his risk for lung cancer and aortic aneurysm. He has undergone lung cancer screenings twice, but no abdominal aortic aneurysm screening has been done. - Order lung cancer CT scan - Order abdominal aortic aneurysm screening ultrasound  Rectal Bleeding He experiences bright red rectal bleeding, possibly related to hemorrhoids, especially after heavy alcohol consumption. - Order colonoscopy - Check complete blood count to assess for anemia  Hypertension He has hypertension and was previously treated with lisinopril . Blood pressure control is crucial to reduce the risk of complications such as aortic aneurysm, especially given his smoking history and existing atherosclerosis. - Restart lisinopril   Hyperlipidemia He has hyperlipidemia and was previously treated with Crestor  and Vascepa . A recent CT scan indicated aortic and coronary artery atherosclerosis, suggesting plaque buildup. Resuming lipid-lowering therapy is necessary to manage cholesterol levels and reduce cardiovascular risk. - Restart Crestor  - Restart Vascepa   Alcohol Use Disorder He consumes approximately six drinks per night, five nights a week, which may exacerbate bleeding tendencies  and affect liver function. He reports occasional bleeding, possibly related to hemorrhoids and alcohol use. - Check liver function tests  Prediabetes He has elevated blood sugar levels with an A1c in the prediabetic range. There is a family history of diabetes, including a brother who was on insulin. Monitoring is necessary to prevent progression to diabetes. - Check A1c level  General Health Maintenance He requires updates on general health screenings and preventive care, including a colonoscopy and abdominal aortic aneurysm screening. He also needs hearing and vision checks. - Order abdominal aortic aneurysm screening ultrasound at [facility] - Order colonoscopy - Schedule hearing and vision tests - Advise on smoking cessation  Follow-Up He will be restarting medications, and it is important to monitor his blood pressure and medication adherence. - Schedule follow-up appointment in 3 months to assess blood pressure and medication adherence       Return in about 3 months (around 07/22/2024) for HTN.     Rosaline Bruns, FNP

## 2024-04-22 ENCOUNTER — Ambulatory Visit: Payer: Self-pay | Admitting: Family Medicine

## 2024-04-22 LAB — ANEMIA PROFILE B
Basophils Absolute: 0.1 x10E3/uL (ref 0.0–0.2)
Basos: 1 %
EOS (ABSOLUTE): 0.3 x10E3/uL (ref 0.0–0.4)
Eos: 4 %
Ferritin: 226 ng/mL (ref 30–400)
Folate: 16.6 ng/mL (ref >3.0)
Hematocrit: 49 % (ref 37.5–51.0)
Hemoglobin: 16.2 g/dL (ref 13.0–17.7)
Immature Grans (Abs): 0 x10E3/uL (ref 0.0–0.1)
Immature Granulocytes: 0 %
Iron Saturation: 37 % (ref 15–55)
Iron: 132 ug/dL (ref 38–169)
Lymphocytes Absolute: 2.3 x10E3/uL (ref 0.7–3.1)
Lymphs: 35 %
MCH: 30 pg (ref 26.6–33.0)
MCHC: 33.1 g/dL (ref 31.5–35.7)
MCV: 91 fL (ref 79–97)
Monocytes Absolute: 0.4 x10E3/uL (ref 0.1–0.9)
Monocytes: 6 %
Neutrophils Absolute: 3.5 x10E3/uL (ref 1.4–7.0)
Neutrophils: 54 %
Platelets: 244 x10E3/uL (ref 150–450)
RBC: 5.4 x10E6/uL (ref 4.14–5.80)
RDW: 12.7 % (ref 11.6–15.4)
Retic Ct Pct: 1.5 % (ref 0.6–2.6)
Total Iron Binding Capacity: 354 ug/dL (ref 250–450)
UIBC: 222 ug/dL (ref 111–343)
Vitamin B-12: 302 pg/mL (ref 232–1245)
WBC: 6.5 x10E3/uL (ref 3.4–10.8)

## 2024-04-22 LAB — CMP14+EGFR
ALT: 12 IU/L (ref 0–44)
AST: 15 IU/L (ref 0–40)
Albumin: 4.7 g/dL (ref 3.8–4.9)
Alkaline Phosphatase: 78 IU/L (ref 44–121)
BUN/Creatinine Ratio: 13 (ref 9–20)
BUN: 12 mg/dL (ref 6–24)
Bilirubin Total: 0.6 mg/dL (ref 0.0–1.2)
CO2: 24 mmol/L (ref 20–29)
Calcium: 9.8 mg/dL (ref 8.7–10.2)
Chloride: 102 mmol/L (ref 96–106)
Creatinine, Ser: 0.91 mg/dL (ref 0.76–1.27)
Globulin, Total: 2.5 g/dL (ref 1.5–4.5)
Glucose: 88 mg/dL (ref 70–99)
Potassium: 4.4 mmol/L (ref 3.5–5.2)
Sodium: 142 mmol/L (ref 134–144)
Total Protein: 7.2 g/dL (ref 6.0–8.5)
eGFR: 98 mL/min/1.73 (ref >59)

## 2024-04-22 LAB — PSA, TOTAL AND FREE
PSA, Free Pct: 8.2 %
PSA, Free: 0.37 ng/mL
Prostate Specific Ag, Serum: 4.5 ng/mL — ABNORMAL HIGH (ref 0.0–4.0)

## 2024-04-22 LAB — LIPID PANEL
Chol/HDL Ratio: 5.9 ratio — ABNORMAL HIGH (ref 0.0–5.0)
Cholesterol, Total: 206 mg/dL — ABNORMAL HIGH (ref 100–199)
HDL: 35 mg/dL — ABNORMAL LOW (ref >39)
LDL Chol Calc (NIH): 141 mg/dL — ABNORMAL HIGH (ref 0–99)
Triglycerides: 166 mg/dL — ABNORMAL HIGH (ref 0–149)
VLDL Cholesterol Cal: 30 mg/dL (ref 5–40)

## 2024-04-22 LAB — THYROID PANEL WITH TSH
Free Thyroxine Index: 1.9 (ref 1.2–4.9)
T3 Uptake Ratio: 26 % (ref 24–39)
T4, Total: 7.4 ug/dL (ref 4.5–12.0)
TSH: 1.42 u[IU]/mL (ref 0.450–4.500)

## 2024-04-22 LAB — BAYER DCA HB A1C WAIVED: HB A1C (BAYER DCA - WAIVED): 5.2 % (ref 4.8–5.6)

## 2024-05-06 ENCOUNTER — Encounter (INDEPENDENT_AMBULATORY_CARE_PROVIDER_SITE_OTHER): Payer: Self-pay | Admitting: *Deleted

## 2024-05-06 ENCOUNTER — Telehealth: Payer: Self-pay

## 2024-05-06 ENCOUNTER — Ambulatory Visit (HOSPITAL_COMMUNITY)
Admission: RE | Admit: 2024-05-06 | Discharge: 2024-05-06 | Disposition: A | Discharge status: Home / Self Care | Source: Ambulatory Visit | Attending: Family Medicine | Admitting: Family Medicine

## 2024-05-06 DIAGNOSIS — I700 Atherosclerosis of aorta: Secondary | ICD-10-CM | POA: Diagnosis not present

## 2024-05-06 DIAGNOSIS — Z0000 Encounter for general adult medical examination without abnormal findings: Secondary | ICD-10-CM | POA: Diagnosis not present

## 2024-05-06 DIAGNOSIS — F17210 Nicotine dependence, cigarettes, uncomplicated: Secondary | ICD-10-CM

## 2024-05-06 DIAGNOSIS — Z122 Encounter for screening for malignant neoplasm of respiratory organs: Secondary | ICD-10-CM

## 2024-05-06 DIAGNOSIS — Z87891 Personal history of nicotine dependence: Secondary | ICD-10-CM | POA: Insufficient documentation

## 2024-05-06 DIAGNOSIS — Z136 Encounter for screening for cardiovascular disorders: Secondary | ICD-10-CM | POA: Diagnosis not present

## 2024-05-06 NOTE — Telephone Encounter (Signed)
 Lung Cancer Screening Narrative/Criteria Questionnaire (Cigarette Smokers Only- No Cigars/Pipes/vapes)   [place] MATSU [place]   SDMV:05/14/2024 at 9am with [place]        October 21, 1965               LDCT: 05/17/2024 at 8:00 am at [facility]    58 y.o.   Phone: [phone]  Lung Screening Narrative (confirm age 52-77 yrs Medicare / 50-80 yrs Private pay insurance)   Insurance information: Community education officer   Referring Provider: Severa, NP   This screening involves an initial phone call with a team member from our program. It is called a shared decision making visit. The initial meeting is required by  insurance and Medicare to make sure you understand the program. This appointment takes about 15-20 minutes to complete. You will complete the screening scan at your scheduled date/time.  This scan takes about 5-10 minutes to complete. You can eat and drink normally before and after the scan.  Criteria questions for Lung Cancer Screening:   Are you a current or former smoker? Current Age began smoking: 13   If you are a former smoker, what year did you quit smoking? N/A(within 15 yrs)   To calculate your smoking history, I need an accurate estimate of how many packs of cigarettes you smoked per day and for how many years. (Not just the number of PPD you are now smoking)   Years smoking 45 x Packs per day 2 = Pack years 90   (at least 20 pack yrs)   (Make sure they understand that we need to know how much they have smoked in the past, not just the number of PPD they are smoking now)  Do you have a personal history of cancer?  No    Do you have a family history of cancer? No  Are you coughing up blood?  No  Have you had unexplained weight loss of 15 lbs or more in the last 6 months? No  It looks like you meet all criteria.  When would be a good time for us  to schedule you for this screening?   Additional information: N/A

## 2024-05-10 NOTE — Telephone Encounter (Signed)
 CALLED PATIENT, NO ANSWER, NO OPTION TO LEAVE VOICE MESSAGE

## 2024-05-13 ENCOUNTER — Encounter: Payer: Self-pay | Admitting: Adult Health

## 2024-05-13 ENCOUNTER — Ambulatory Visit (INDEPENDENT_AMBULATORY_CARE_PROVIDER_SITE_OTHER): Admitting: Adult Health

## 2024-05-13 DIAGNOSIS — F17210 Nicotine dependence, cigarettes, uncomplicated: Secondary | ICD-10-CM

## 2024-05-13 NOTE — Progress Notes (Signed)
  Virtual Visit via Telephone Note  I connected with Robert Lambert , 05/13/24 9:07 AM by a telemedicine application and verified that I am speaking with the correct person using two identifiers.  Location: Patient: home Provider: home   I discussed the limitations of evaluation and management by telemedicine and the availability of in person appointments. The patient expressed understanding and agreed to proceed.   Shared Decision Making Visit Lung Cancer Screening Program [id number])   Eligibility: 58 y.o. Pack Years Smoking History Calculation = 90 pack years (# packs/per year x # years smoked) Recent History of coughing up blood  no Unexplained weight loss? no ( >Than 15 pounds within the last 6 months ) Prior History Lung / other cancer no (Diagnosis within the last 5 years already requiring surveillance chest CT Scans). Smoking Status Current Smoker  Visit Components: Discussion included one or more decision making aids. YES Discussion included risk/benefits of screening. YES Discussion included potential follow up diagnostic testing for abnormal scans. YES Discussion included meaning and risk of over diagnosis. YES Discussion included meaning and risk of False Positives. YES Discussion included meaning of total radiation exposure. YES  Counseling Included: Importance of adherence to annual lung cancer LDCT screening. YES Impact of comorbidities on ability to participate in the program. YES Ability and willingness to under diagnostic treatment. YES  Smoking Cessation Counseling: Current Smokers:  Discussed importance of smoking cessation. yes Information about tobacco cessation classes and interventions provided to patient. yes Patient provided with ticket for LDCT Scan. yes Symptomatic Patient. NO Diagnosis Code: Tobacco Use Z72.0 Asymptomatic Patient yes  Counseling - 4 minutes of smoking cessation counseling (CT Chest Lung Cancer Screening Low Dose W/O CM)  [id number]  Z12.2-Screening of respiratory organs Z87.891-Personal history of nicotine  dependence   [name] 05/13/24

## 2024-05-13 NOTE — Patient Instructions (Signed)

## 2024-05-14 ENCOUNTER — Encounter: Admitting: Adult Health

## 2024-05-17 ENCOUNTER — Ambulatory Visit (HOSPITAL_COMMUNITY)
Admission: RE | Admit: 2024-05-17 | Discharge: 2024-05-17 | Disposition: A | Discharge status: Home / Self Care | Source: Ambulatory Visit | Attending: Family Medicine | Admitting: Family Medicine

## 2024-05-17 DIAGNOSIS — Z87891 Personal history of nicotine dependence: Secondary | ICD-10-CM | POA: Insufficient documentation

## 2024-05-17 DIAGNOSIS — Z122 Encounter for screening for malignant neoplasm of respiratory organs: Secondary | ICD-10-CM | POA: Diagnosis not present

## 2024-05-17 DIAGNOSIS — F17210 Nicotine dependence, cigarettes, uncomplicated: Secondary | ICD-10-CM | POA: Insufficient documentation

## 2024-05-24 ENCOUNTER — Encounter (INDEPENDENT_AMBULATORY_CARE_PROVIDER_SITE_OTHER): Payer: Self-pay | Admitting: Gastroenterology

## 2024-05-24 ENCOUNTER — Ambulatory Visit (INDEPENDENT_AMBULATORY_CARE_PROVIDER_SITE_OTHER): Admitting: Gastroenterology

## 2024-05-24 VITALS — BP 128/76 | HR 76 | Temp 98.0°F | Ht 70.0 in | Wt 193.7 lb

## 2024-05-24 DIAGNOSIS — K219 Gastro-esophageal reflux disease without esophagitis: Secondary | ICD-10-CM | POA: Insufficient documentation

## 2024-05-24 DIAGNOSIS — R1310 Dysphagia, unspecified: Secondary | ICD-10-CM

## 2024-05-24 DIAGNOSIS — K5904 Chronic idiopathic constipation: Secondary | ICD-10-CM | POA: Insufficient documentation

## 2024-05-24 DIAGNOSIS — K921 Melena: Secondary | ICD-10-CM

## 2024-05-24 DIAGNOSIS — K5900 Constipation, unspecified: Secondary | ICD-10-CM

## 2024-05-24 MED ORDER — POLYETHYLENE GLYCOL 3350 17 G PO PACK: 17.0000 g | PACK | Freq: Two times a day (BID) | ORAL | 0 refills | Status: AC

## 2024-05-24 MED ORDER — PSYLLIUM 58.6 % PO PACK: 1.0000 | PACK | Freq: Two times a day (BID) | ORAL | 2 refills | Status: AC

## 2024-05-24 MED ORDER — PANTOPRAZOLE SODIUM 40 MG PO TBEC
40.0000 mg | DELAYED_RELEASE_TABLET | Freq: Every day | ORAL | 3 refills | Status: AC
  Filled 2024-07-06: qty 60, 60d supply, fill #0
  Filled 2024-09-29: qty 60, 60d supply, fill #1

## 2024-05-24 NOTE — Progress Notes (Signed)
 Robert Lambert , M.D. Gastroenterology & Hepatology [facility] [address] [place], [place] [address] Primary Care Physician: Severa Rock HERO, FNP [address] [place] [address]  Chief Complaint:   painless hematochezia, dysphagia, Barrett's screening  History of Present Illness: Robert Lambert is a 58 y.o. male with hypertension and hyperlipidemia who presents for evaluation of  painless hematochezia, dysphagia, Barrett's screening.  Patient reports for past couple months he has been noticing fresh blood upon wiping and intermittently mixed with stool.  Patient has on Bristol stool scale bowel movement type III every other day without any straining.  Patient reports intermittent solid and liquid food dysphagia and a knot in the middle of his chest occasionally. The patient denies having any nausea, vomiting, fever, chills, hematochezia, melena, hematemesis, abdominal distention, abdominal pain, diarrhea, jaundice, pruritus or weight loss.  Hemoglobin 16.2 platelet 244 liver enzymes normal.  Labs from 03/2024 in [facility] portal with ferritin 226 iron saturation 37 vitamin B12 302 folate 16.6  Last ZHI:wnwz Last Colonoscopy:10+ years ago   FHx: neg for any gastrointestinal/liver disease, no malignancies Social: Active tobacco smoker Surgical: no abdominal surgeries  Past Medical History: Past Medical History:  Diagnosis Date   Hyperlipidemia    Hypertension    Prediabetes    Tobacco use disorder     Past Surgical History: Past Surgical History:  Procedure Laterality Date   COLONOSCOPY     UPPER GASTROINTESTINAL ENDOSCOPY      Family History: Family History  Problem Relation Age of Onset   Hyperlipidemia Mother    Hypertension Mother    Diabetes Mother    Heart disease Mother    Hypertension Father    Heart failure Father    Diabetes Maternal Grandfather     Social History: Social History   Tobacco Use   Smoking Status Every Day   Current packs/day: 1.00   Average packs/day: 1 pack/day for 85.6 years (85.6 ttl pk-yrs)   Types: Cigarettes   Start date: 1980  Smokeless Tobacco Never   Social History   Substance and Sexual Activity  Alcohol Use Yes   Comment: drinks 6 drinks daily   Social History   Substance and Sexual Activity  Drug Use Never    Allergies: No Known Allergies  Medications: Current Outpatient Medications  Medication Sig Dispense Refill   icosapent  Ethyl (VASCEPA ) 1 g capsule Take 2 capsules (2 g total) by mouth 2 (two) times daily. 120 capsule 2   lisinopril  (ZESTRIL ) 10 MG tablet Take 1 tablet (10 mg total) by mouth daily. 90 tablet 1   pantoprazole  (PROTONIX ) 40 MG tablet Take 1 tablet (40 mg total) by mouth daily. 60 tablet 3   polyethylene glycol (MIRALAX  / GLYCOLAX ) 17 g packet Take 17 g by mouth 2 (two) times daily. 180 packet 0   psyllium (METAMUCIL) 58.6 % packet Take 1 packet by mouth 2 (two) times daily. 60 packet 2   rosuvastatin  (CRESTOR ) 20 MG tablet Take 1 tablet (20 mg total) by mouth daily. 90 tablet 1   No current facility-administered medications for this visit.    Review of Systems: GENERAL: negative for malaise, night sweats HEENT: No changes in hearing or vision, no nose bleeds or other nasal problems. NECK: Negative for lumps, goiter, pain and significant neck swelling RESPIRATORY: Negative for cough, wheezing CARDIOVASCULAR: Negative for chest pain, leg swelling, palpitations, orthopnea GI: SEE HPI MUSCULOSKELETAL: Negative for joint pain or swelling, back pain, and muscle pain.  SKIN: Negative for lesions, rash HEMATOLOGY Negative for prolonged bleeding, bruising easily, and swollen nodes. ENDOCRINE: Negative for cold or heat intolerance, polyuria, polydipsia and goiter. NEURO: negative for tremor, gait imbalance, syncope and seizures. The remainder of the review of systems is noncontributory.   Physical Exam: BP 128/76    Pulse 76   Temp 98 F (36.7 C)   Ht 5' 10 (1.778 m)   Wt 193 lb 11.2 oz (87.9 kg)   BMI 27.79 kg/m  GENERAL: The patient is AO x3, in no acute distress. HEENT: Head is normocephalic and atraumatic. EOMI are intact. Mouth is well hydrated and without lesions. NECK: Supple. No masses LUNGS: Clear to auscultation. No presence of rhonchi/wheezing/rales. Adequate chest expansion HEART: RRR, normal s1 and s2. ABDOMEN: Soft, nontender, no guarding, no peritoneal signs, and nondistended. BS +. No masses.  Imaging/Labs: as above     Latest Ref Rng & Units 04/21/2024    4:11 PM 12/18/2019    1:54 AM  CBC  WBC 3.4 - 10.8 x10E3/uL 6.5  6.0   Hemoglobin 13.0 - 17.7 g/dL 83.7  82.8   Hematocrit 37.5 - 51.0 % 49.0  52.6   Platelets 150 - 450 x10E3/uL 244  196    Lab Results  Component Value Date   IRON 132 04/21/2024   TIBC 354 04/21/2024   FERRITIN 226 04/21/2024    I personally reviewed and interpreted the available labs, imaging and endoscopic files.  Impression and Plan: Robert Lambert is a 58 y.o. male with hypertension and hyperlipidemia who presents for evaluation of  painless hematochezia, dysphagia, Barrett's screening.  #Painless hematochezia #Intermittent constipation  Patient has painless hematochezia which could be hemorrhoidal bleed but recommend ruling out inflammatory bowel disease, polyps or malignancy  Last colonoscopy over 10 years ago and patient is due for colonoscopy as of  Schedule colonoscopy  Ensure adequate fluid intake: Aim for 8 glasses of water daily. Follow a high fiber diet: Include foods such as dates, prunes, pears, and kiwi. Take Miralax  daily  Use Metamucil daily   #Dysphagia #Barrett's Screening   As per ACG guidelines it is recommended single screening endoscopy for patients with chronic GERD symptoms and 3 or more additional risk factors for BE, including male sex, age >50 years, White race, tobacco smoking, obesity, and family history  of BE or EAC in a first-degree relative   Patient is a 58 year old white male with truncal obesity with chronic cigarette smoking would benefit from one-time screening upper endoscopy  Also patient has intermittent solid food dysphagia which could be esophageal web ring or stricture which cannot be ruled out with upper endoscopy  Schedule upper endoscopy with dilation  Protonix  30 minutes before breakfast  I thoroughly discussed with the patient the procedure, including the risks involved. Patient understands what the procedure involves including the benefits and any risks. Patient understands alternatives to the proposed procedure. Risks including (but not limited to) bleeding, tearing of the lining (perforation), rupture of adjacent organs, problems with heart and lung function, infection, and medication reactions. A small percentage of complications may require surgery, hospitalization, repeat endoscopic procedure, and/or transfusion.  Patient understood and agreed.   All questions were answered.      Amedee Cerrone Faizan Lauralye Kinn, MD Gastroenterology and Hepatology [facility]   This chart has been completed using [facility] Dictation software, and while attempts have been made to ensure accuracy , certain words and phrases may not be transcribed as intended

## 2024-05-24 NOTE — Patient Instructions (Signed)
 It was very nice to meet you today, as dicussed with will plan for the following :  1) Upper endoscopy and colonoscopy  2) Ensure adequate fluid intake: Aim for 8 glasses of water daily. Follow a high fiber diet: Include foods such as dates, prunes, pears, and kiwi. Take Miralax  daily  Use Metamucil daily   3) protonix  30 mg before breakfast

## 2024-06-01 ENCOUNTER — Other Ambulatory Visit: Payer: Self-pay | Admitting: Acute Care

## 2024-06-01 DIAGNOSIS — Z122 Encounter for screening for malignant neoplasm of respiratory organs: Secondary | ICD-10-CM

## 2024-06-01 DIAGNOSIS — Z87891 Personal history of nicotine dependence: Secondary | ICD-10-CM

## 2024-06-01 DIAGNOSIS — F17210 Nicotine dependence, cigarettes, uncomplicated: Secondary | ICD-10-CM

## 2024-06-10 ENCOUNTER — Telehealth: Payer: Self-pay | Admitting: *Deleted

## 2024-06-10 NOTE — Telephone Encounter (Signed)
 Called pt to schedule TCS/EGD/DIL with Dr. Cinderella, any room. He reports he is going to have to call back when he is near a calendar

## 2024-06-17 NOTE — Telephone Encounter (Signed)
 ATC pt, no answer. VM full.

## 2024-07-05 ENCOUNTER — Other Ambulatory Visit (HOSPITAL_COMMUNITY): Payer: Self-pay

## 2024-07-06 ENCOUNTER — Other Ambulatory Visit (HOSPITAL_COMMUNITY): Payer: Self-pay

## 2024-07-08 ENCOUNTER — Other Ambulatory Visit (INDEPENDENT_AMBULATORY_CARE_PROVIDER_SITE_OTHER): Payer: Self-pay | Admitting: *Deleted

## 2024-07-08 ENCOUNTER — Encounter (INDEPENDENT_AMBULATORY_CARE_PROVIDER_SITE_OTHER): Payer: Self-pay | Admitting: *Deleted

## 2024-07-08 ENCOUNTER — Other Ambulatory Visit (HOSPITAL_COMMUNITY): Payer: Self-pay

## 2024-07-08 MED ORDER — PEG 3350-KCL-NA BICARB-NACL 420 G PO SOLR
4000.0000 mL | Freq: Once | ORAL | 0 refills | Status: AC
Start: 2024-07-08 — End: 2024-07-09
  Filled 2024-07-08: qty 4000, 2d supply, fill #0

## 2024-07-14 ENCOUNTER — Other Ambulatory Visit (HOSPITAL_COMMUNITY): Payer: Self-pay

## 2024-07-23 ENCOUNTER — Other Ambulatory Visit (HOSPITAL_COMMUNITY): Payer: Self-pay

## 2024-07-23 MED ORDER — FLUZONE 0.5 ML IM SUSY
0.5000 mL | PREFILLED_SYRINGE | Freq: Once | INTRAMUSCULAR | 0 refills | Status: AC
  Filled 2024-07-23: qty 0.5, 1d supply, fill #0

## 2024-07-27 ENCOUNTER — Ambulatory Visit: Admitting: Family Medicine

## 2024-07-27 ENCOUNTER — Encounter: Payer: Self-pay | Admitting: Family Medicine

## 2024-07-27 ENCOUNTER — Ambulatory Visit: Payer: Self-pay | Admitting: Family Medicine

## 2024-07-27 DIAGNOSIS — I10 Essential (primary) hypertension: Secondary | ICD-10-CM

## 2024-07-28 ENCOUNTER — Other Ambulatory Visit: Payer: Self-pay

## 2024-08-11 ENCOUNTER — Encounter (INDEPENDENT_AMBULATORY_CARE_PROVIDER_SITE_OTHER): Payer: Self-pay | Admitting: *Deleted

## 2024-08-11 ENCOUNTER — Encounter (HOSPITAL_COMMUNITY)
Admission: RE | Disposition: A | Discharge status: Home / Self Care | Payer: Self-pay | Source: Home / Self Care | Attending: Gastroenterology

## 2024-08-11 ENCOUNTER — Encounter (HOSPITAL_COMMUNITY): Payer: Self-pay | Admitting: Gastroenterology

## 2024-08-11 ENCOUNTER — Ambulatory Visit (HOSPITAL_COMMUNITY): Admitting: Anesthesiology

## 2024-08-11 ENCOUNTER — Ambulatory Visit (HOSPITAL_COMMUNITY)
Admission: RE | Admit: 2024-08-11 | Discharge: 2024-08-11 | Disposition: A | Discharge status: Home / Self Care | Attending: Gastroenterology | Admitting: Gastroenterology

## 2024-08-11 ENCOUNTER — Other Ambulatory Visit: Payer: Self-pay

## 2024-08-11 DIAGNOSIS — K222 Esophageal obstruction: Secondary | ICD-10-CM

## 2024-08-11 DIAGNOSIS — K644 Residual hemorrhoidal skin tags: Secondary | ICD-10-CM | POA: Insufficient documentation

## 2024-08-11 DIAGNOSIS — K2970 Gastritis, unspecified, without bleeding: Secondary | ICD-10-CM | POA: Insufficient documentation

## 2024-08-11 DIAGNOSIS — K635 Polyp of colon: Secondary | ICD-10-CM | POA: Diagnosis not present

## 2024-08-11 DIAGNOSIS — I10 Essential (primary) hypertension: Secondary | ICD-10-CM | POA: Diagnosis not present

## 2024-08-11 DIAGNOSIS — K2091 Esophagitis, unspecified with bleeding: Secondary | ICD-10-CM | POA: Diagnosis not present

## 2024-08-11 DIAGNOSIS — K2100 Gastro-esophageal reflux disease with esophagitis, without bleeding: Secondary | ICD-10-CM | POA: Insufficient documentation

## 2024-08-11 DIAGNOSIS — K5730 Diverticulosis of large intestine without perforation or abscess without bleeding: Secondary | ICD-10-CM | POA: Insufficient documentation

## 2024-08-11 DIAGNOSIS — K209 Esophagitis, unspecified without bleeding: Secondary | ICD-10-CM

## 2024-08-11 DIAGNOSIS — K648 Other hemorrhoids: Secondary | ICD-10-CM | POA: Insufficient documentation

## 2024-08-11 DIAGNOSIS — K529 Noninfective gastroenteritis and colitis, unspecified: Secondary | ICD-10-CM | POA: Diagnosis not present

## 2024-08-11 DIAGNOSIS — D125 Benign neoplasm of sigmoid colon: Secondary | ICD-10-CM

## 2024-08-11 DIAGNOSIS — Z13810 Encounter for screening for upper gastrointestinal disorder: Secondary | ICD-10-CM | POA: Diagnosis not present

## 2024-08-11 DIAGNOSIS — F17210 Nicotine dependence, cigarettes, uncomplicated: Secondary | ICD-10-CM | POA: Insufficient documentation

## 2024-08-11 DIAGNOSIS — R1310 Dysphagia, unspecified: Secondary | ICD-10-CM | POA: Diagnosis not present

## 2024-08-11 DIAGNOSIS — I2510 Atherosclerotic heart disease of native coronary artery without angina pectoris: Secondary | ICD-10-CM

## 2024-08-11 DIAGNOSIS — K2951 Unspecified chronic gastritis with bleeding: Secondary | ICD-10-CM | POA: Diagnosis not present

## 2024-08-11 DIAGNOSIS — K921 Melena: Secondary | ICD-10-CM | POA: Insufficient documentation

## 2024-08-11 DIAGNOSIS — K6389 Other specified diseases of intestine: Secondary | ICD-10-CM | POA: Diagnosis not present

## 2024-08-11 DIAGNOSIS — D1779 Benign lipomatous neoplasm of other sites: Secondary | ICD-10-CM

## 2024-08-11 HISTORY — PX: COLONOSCOPY: SHX5424

## 2024-08-11 HISTORY — PX: ESOPHAGEAL DILATION: SHX303

## 2024-08-11 HISTORY — PX: ESOPHAGOGASTRODUODENOSCOPY: SHX5428

## 2024-08-11 LAB — HM COLONOSCOPY

## 2024-08-11 SURGERY — COLONOSCOPY
Anesthesia: Monitor Anesthesia Care

## 2024-08-11 MED ORDER — PROPOFOL 500 MG/50ML IV EMUL
INTRAVENOUS | Status: DC | PRN
  Administered 2024-08-11: 150 ug/kg/min via INTRAVENOUS

## 2024-08-11 MED ORDER — HYDROCORTISONE ACETATE 25 MG RE SUPP: 25.0000 mg | Freq: Every day | RECTAL | 0 refills | Status: AC

## 2024-08-11 MED ORDER — LACTATED RINGERS IV SOLN: INTRAVENOUS | Status: DC

## 2024-08-11 MED ORDER — LIDOCAINE 2% (20 MG/ML) 5 ML SYRINGE
INTRAMUSCULAR | Status: DC | PRN
  Administered 2024-08-11: 80 mg via INTRAVENOUS

## 2024-08-11 MED ORDER — PHENYLEPHRINE 80 MCG/ML (10ML) SYRINGE FOR IV PUSH (FOR BLOOD PRESSURE SUPPORT)
PREFILLED_SYRINGE | INTRAVENOUS | Status: DC | PRN
  Administered 2024-08-11 (×2): 80 ug via INTRAVENOUS

## 2024-08-11 MED ORDER — PROPOFOL 10 MG/ML IV BOLUS
INTRAVENOUS | Status: DC | PRN
  Administered 2024-08-11: 20 mg via INTRAVENOUS
  Administered 2024-08-11 (×3): 50 mg via INTRAVENOUS
  Administered 2024-08-11: 100 mg via INTRAVENOUS
  Administered 2024-08-11 (×3): 50 mg via INTRAVENOUS

## 2024-08-11 NOTE — Op Note (Signed)
 [facility] Patient Name: Robert Lambert Procedure Date: 08/11/2024 10:30 AM MRN: [id number] Date of Birth: Jan 16, 1966 Attending MD: Deatrice Dine , MD, [id number] CSN: [id number] Age: 58 Admit Type: Outpatient Procedure:                Colonoscopy Indications:              Hematochezia Providers:                Deatrice Dine, MD, Harlene Lips, Devere Lodge Referring MD:              Medicines:                Monitored Anesthesia Care Complications:            No immediate complications. Estimated Blood Loss:     Estimated blood loss was minimal. Procedure:                Pre-Anesthesia Assessment:                           - Prior to the procedure, a History and Physical                            was performed, and patient medications and                            allergies were reviewed. The patient's tolerance of                            previous anesthesia was also reviewed. The risks                            and benefits of the procedure and the sedation                            options and risks were discussed with the patient.                            All questions were answered, and informed consent                            was obtained. Prior Anticoagulants: The patient has                            taken no anticoagulant or antiplatelet agents. ASA                            Grade Assessment: II - A patient with mild systemic                            disease. After reviewing the risks and benefits,                            the patient was deemed in satisfactory condition to  undergo the procedure.                           After obtaining informed consent, the colonoscope                            was passed under direct vision. Throughout the                            procedure, the patient's blood pressure, pulse, and                            oxygen saturations were monitored continuously. The                             [id number]-[id number] ([id number]) Colon was introduced through                            the anus and advanced to the the terminal ileum.                            The colonoscopy was performed without difficulty.                            The patient tolerated the procedure well. The                            quality of the bowel preparation was evaluated                            using the BBPS [facility] Bowel Preparation Scale)                            with scores of: Right Colon = 3, Transverse Colon =                            3 and Left Colon = 3 (entire mucosa seen well with                            no residual staining, small fragments of stool or                            opaque liquid). The total BBPS score equals 9. The                            terminal ileum, ileocecal valve, appendiceal                            orifice, and rectum were photographed. Scope In: 11:01:05 AM Scope Out: 11:24:25 AM Scope Withdrawal Time: 0 hours 19 minutes 53 seconds  Total Procedure Duration: 0 hours 23 minutes 20 seconds  Findings:      Hemorrhoids were found on perianal exam.      An area of nodular  mucosa was found at the ileocecal valve. Biopsies       were taken with a cold forceps for histology.      Four sessile polyps were found in the sigmoid colon. The polyps were 3       to 6 mm in size. These polyps were removed with a cold snare. Resection       and retrieval were complete.      A few small-mouthed diverticula were found in the left colon.      Non-bleeding external and internal hemorrhoids were found during       retroflexion. The hemorrhoids were medium-sized. Impression:               - Hemorrhoids found on perianal exam.                           - Nodular mucosa at the ileocecal valve. Biopsied.                           - Four 3 to 6 mm polyps in the sigmoid colon,                            removed with a cold snare. Resected and retrieved.                           -  Diverticulosis in the left colon.                           - Non-bleeding external and internal hemorrhoids. Moderate Sedation:      Per Anesthesia Care Recommendation:           - Patient has a contact number available for                            emergencies. The signs and symptoms of potential                            delayed complications were discussed with the                            patient. Return to normal activities tomorrow.                            Written discharge instructions were provided to the                            patient.                           - Resume previous diet.                           - Continue present medications.                           - Await pathology results.                           -  Repeat colonoscopy in 5 years for surveillance                            based on pathology results.                           - Return to GI clinic as previously scheduled.                           -Anusol supporsitory                           -If patient has any further hematochezia will refer                            to colorectal surgery for evaluation of external                            hemorrhoids Procedure Code(s):        --- Professional ---                           [phone], Colonoscopy, flexible; with removal of                            tumor(s), polyp(s), or other lesion(s) by snare                            technique                           45380, 59, Colonoscopy, flexible; with biopsy,                            single or multiple Diagnosis Code(s):        --- Professional ---                           K63.89, Other specified diseases of intestine                           D12.5, Benign neoplasm of sigmoid colon                           K64.8, Other hemorrhoids                           K92.1, Melena (includes Hematochezia)                           K57.30, Diverticulosis of large intestine without                             perforation or abscess without bleeding CPT copyright 2022 American Medical Association. All rights reserved. The codes documented in this report are preliminary and upon coder review may  be revised to meet current compliance requirements. Bodie Abernethy, MD Deatrice Dine, MD  08/11/2024 11:35:11 AM This report has been signed electronically. Number of Addenda: 0

## 2024-08-11 NOTE — Discharge Instructions (Signed)

## 2024-08-11 NOTE — OR Nursing (Signed)
 Robert Lambert was at [facility] on 08/11/24 and cannot return to work until noon on 08/12/24.

## 2024-08-11 NOTE — Op Note (Signed)
 [facility] Patient Name: Robert Lambert Procedure Date: 08/11/2024 10:31 AM MRN: [id number] Date of Birth: 16-Sep-1966 Attending MD: Deatrice Dine , MD, [id number] CSN: [id number] Age: 58 Admit Type: Outpatient Procedure:                Upper GI endoscopy Indications:              Dysphagia, Screening for Barrett's esophagus in                            patient at risk for this condition Providers:                Deatrice Dine, MD, Harlene Lips, Devere Lodge Referring MD:              Medicines:                Monitored Anesthesia Care Complications:            No immediate complications. Estimated Blood Loss:     Estimated blood loss was minimal. Procedure:                Pre-Anesthesia Assessment:                           - Prior to the procedure, a History and Physical                            was performed, and patient medications and                            allergies were reviewed. The patient's tolerance of                            previous anesthesia was also reviewed. The risks                            and benefits of the procedure and the sedation                            options and risks were discussed with the patient.                            All questions were answered, and informed consent                            was obtained. Prior Anticoagulants: The patient has                            taken no anticoagulant or antiplatelet agents. ASA                            Grade Assessment: II - A patient with mild systemic                            disease. After reviewing the risks and benefits,  the patient was deemed in satisfactory condition to                            undergo the procedure.                           After obtaining informed consent, the endoscope was                            passed under direct vision. Throughout the                            procedure, the patient's blood pressure, pulse, and                             oxygen saturations were monitored continuously. The                            [id number] ([id number]) Upper was introduced through                            the mouth, and advanced to the second part of                            duodenum. The upper GI endoscopy was accomplished                            without difficulty. The patient tolerated the                            procedure well. Scope In: 10:46:17 AM Scope Out: 10:53:24 AM Total Procedure Duration: 0 hours 7 minutes 7 seconds  Findings:      A mild Schatzki ring was found in the lower third of the esophagus. A       TTS dilator was passed through the scope. Dilation with a 15-16.5-18 mm       balloon dilator was performed to 18 mm. Biopsies were taken with a cold       forceps for histology.      Mild inflammation characterized by erythema was found in the gastric       antrum. Biopsies were taken with a cold forceps for histology.      The duodenal bulb and second portion of the duodenum were normal. Impression:               - Mild Schatzki ring. Dilated. Biopsied.                           - Gastritis. Biopsied.                           - Normal duodenal bulb and second portion of the                            duodenum. Moderate Sedation:      Per Anesthesia Care Recommendation:           -  Patient has a contact number available for                            emergencies. The signs and symptoms of potential                            delayed complications were discussed with the                            patient. Return to normal activities tomorrow.                            Written discharge instructions were provided to the                            patient.                           - Resume previous diet.                           - Continue present medications.                           - Await pathology results. Procedure Code(s):        --- Professional ---                            [phone], Esophagogastroduodenoscopy, flexible,                            transoral; with transendoscopic balloon dilation of                            esophagus (less than 30 mm diameter)                           43239, 59, Esophagogastroduodenoscopy, flexible,                            transoral; with biopsy, single or multiple Diagnosis Code(s):        --- Professional ---                           K22.2, Esophageal obstruction                           K29.70, Gastritis, unspecified, without bleeding                           R13.10, Dysphagia, unspecified                           Z13.810, Encounter for screening for upper                            gastrointestinal disorder CPT copyright 2022 American Medical Association. All rights  reserved. The codes documented in this report are preliminary and upon coder review may  be revised to meet current compliance requirements. Deatrice Dine, MD Deatrice Dine, MD 08/11/2024 10:56:37 AM This report has been signed electronically. Number of Addenda: 0

## 2024-08-11 NOTE — Anesthesia Preprocedure Evaluation (Signed)
 Anesthesia Evaluation  Patient identified by MRN, date of birth, ID band Patient awake    Reviewed: Allergy & Precautions, H&P , NPO status , Patient's Chart, lab work & pertinent test results, reviewed documented beta blocker date and time   Airway Mallampati: II  TM Distance: >3 FB Neck ROM: full    Dental no notable dental hx.    Pulmonary neg pulmonary ROS, Current Smoker   Pulmonary exam normal breath sounds clear to auscultation       Cardiovascular Exercise Tolerance: Good hypertension, + CAD   Rhythm:regular Rate:Normal     Neuro/Psych negative neurological ROS  negative psych ROS   GI/Hepatic Neg liver ROS,GERD  ,,  Endo/Other  negative endocrine ROS    Renal/GU negative Renal ROS  negative genitourinary   Musculoskeletal   Abdominal   Peds  Hematology negative hematology ROS (+)   Anesthesia Other Findings   Reproductive/Obstetrics negative OB ROS                              Anesthesia Physical Anesthesia Plan  ASA: 2  Anesthesia Plan: MAC   Post-op Pain Management:    Induction:   PONV Risk Score and Plan: Propofol infusion  Airway Management Planned:   Additional Equipment:   Intra-op Plan:   Post-operative Plan:   Informed Consent: I have reviewed the patients History and Physical, chart, labs and discussed the procedure including the risks, benefits and alternatives for the proposed anesthesia with the patient or authorized representative who has indicated his/her understanding and acceptance.     Dental Advisory Given  Plan Discussed with: CRNA  Anesthesia Plan Comments:         Anesthesia Quick Evaluation

## 2024-08-11 NOTE — Transfer of Care (Signed)
 Immediate Anesthesia Transfer of Care Note  Patient: Robert Lambert  Procedure(s) Performed: COLONOSCOPY EGD (ESOPHAGOGASTRODUODENOSCOPY) DILATION, ESOPHAGUS  Patient Location: PACU and Endoscopy Unit  Anesthesia Type:General  Level of Consciousness: awake  Airway & Oxygen Therapy: Patient Spontanous Breathing  Post-op Assessment: Report given to RN  Post vital signs: Reviewed and stable  Last Vitals:  Vitals Value Taken Time  BP    Temp    Pulse    Resp    SpO2      Last Pain:  Vitals:   08/11/24 1040  TempSrc:   PainSc: 0-No pain      Patients Stated Pain Goal: 4 (08/11/24 0910)  Complications: No notable events documented.

## 2024-08-11 NOTE — H&P (Signed)
 Primary Care Physician:  Robert Rock HERO, FNP Primary Gastroenterologist:  Dr. Cinderella  Pre-Procedure History & Physical: HPI:  Robert Lambert is a 58 y.o. male with hypertension and hyperlipidemia who presents for evaluation of  painless hematochezia, dysphagia, Barrett's screening.   Patient reports for past couple months he has been noticing fresh blood upon wiping and intermittently mixed with stool.  Patient has on Bristol stool scale bowel movement type III every other day without any straining.  Patient reports intermittent solid and liquid food dysphagia and a knot in the middle of his chest occasionally. The patient denies having any nausea, vomiting, fever, chills, hematochezia, melena, hematemesis, abdominal distention, abdominal pain, diarrhea, jaundice, pruritus or weight loss.   Hemoglobin 16.2 platelet 244 liver enzymes normal.  Labs from 03/2024 in [facility] portal with ferritin 226 iron saturation 37 vitamin B12 302 folate 16.6   Last ZHI:wnwz Last Colonoscopy:10+ years ago    FHx: neg for any gastrointestinal/liver disease, no malignancies Social: Active tobacco smoker Surgical: no abdominal surgeries    Past Medical History:  Diagnosis Date   Hyperlipidemia    Hypertension    Prediabetes    Tobacco use disorder     Past Surgical History:  Procedure Laterality Date   COLONOSCOPY     UPPER GASTROINTESTINAL ENDOSCOPY      Prior to Admission medications   Medication Sig Start Date End Date Taking? Authorizing Provider  icosapent  Ethyl (VASCEPA ) 1 g capsule Take 2 capsules (2 g total) by mouth 2 (two) times daily. 04/21/24  Yes Rakes, Rock HERO, FNP  lisinopril  (ZESTRIL ) 10 MG tablet Take 1 tablet (10 mg total) by mouth daily. 04/21/24  Yes Rakes, Rock HERO, FNP  polyethylene glycol (MIRALAX  / GLYCOLAX ) 17 g packet Take 17 g by mouth 2 (two) times daily. 05/24/24 08/22/24 Yes Tevon Berhane F, MD  psyllium (METAMUCIL) 58.6 % packet Take 1 packet by mouth 2 (two) times  daily. 05/24/24 08/22/24 Yes Reid Regas, Deatrice FALCON, MD  rosuvastatin  (CRESTOR ) 20 MG tablet Take 1 tablet (20 mg total) by mouth daily. 04/21/24  Yes Rakes, Rock HERO, FNP  pantoprazole  (PROTONIX ) 40 MG tablet Take 1 tablet (40 mg total) by mouth daily. 05/24/24   Cinderella Deatrice FALCON, MD    Allergies as of 07/08/2024   (No Known Allergies)    Family History  Problem Relation Age of Onset   Hyperlipidemia Mother    Hypertension Mother    Diabetes Mother    Heart disease Mother    Hypertension Father    Heart failure Father    Diabetes Maternal Grandfather     Social History   Socioeconomic History   Marital status: Single    Spouse name: Not on file   Number of children: Not on file   Years of education: Not on file   Highest education level: Not on file  Occupational History   Not on file  Tobacco Use   Smoking status: Every Day    Current packs/day: 1.00    Average packs/day: 1 pack/day for 85.9 years (85.9 ttl pk-yrs)    Types: Cigarettes    Start date: 8   Smokeless tobacco: Never  Vaping Use   Vaping status: Never Used  Substance and Sexual Activity   Alcohol use: Yes    Comment: drinks 6 drinks daily   Drug use: Never   Sexual activity: Yes  Other Topics Concern   Not on file  Social History Narrative   Not on file   Social  Drivers of Corporate Investment Banker Strain: Not on file  Food Insecurity: Not on file  Transportation Needs: Not on file  Physical Activity: Not on file  Stress: Not on file  Social Connections: Not on file  Intimate Partner Violence: Not on file    Review of Systems: See HPI, otherwise negative ROS  Physical Exam: Vital signs in last 24 hours: Temp:  [98.3 F (36.8 C)] 98.3 F (36.8 C) (11/12 0910) Pulse Rate:  [78] 78 (11/12 0910) Resp:  [16] 16 (11/12 0910) BP: (130)/(89) 130/89 (11/12 0910) SpO2:  [95 %] 95 % (11/12 0910) Weight:  [90.7 kg] 90.7 kg (11/12 0910)   General:   Alert,  Well-developed, well-nourished,  pleasant and cooperative in NAD Head:  Normocephalic and atraumatic. Eyes:  Sclera clear, no icterus.   Conjunctiva pink. Ears:  Normal auditory acuity. Nose:  No deformity, discharge,  or lesions. Msk:  Symmetrical without gross deformities. Normal posture. Extremities:  Without clubbing or edema. Neurologic:  Alert and  oriented x4;  grossly normal neurologically. Skin:  Intact without significant lesions or rashes. Psych:  Alert and cooperative. Normal mood and affect.  Impression/Plan:Robert Lambert is a 58 y.o. male with hypertension and hyperlipidemia who presents for evaluation of  painless hematochezia, dysphagia, Barrett's screening.   Proceed with EGD with dilation  and Colonoscopy   The risks of the procedure including infection, bleed, or perforation as well as benefits, limitations, alternatives and imponderables have been reviewed with the patient. Questions have been answered. All parties agreeable.

## 2024-08-11 NOTE — Anesthesia Postprocedure Evaluation (Signed)
 Anesthesia Post Note  Patient: Robert Lambert  Procedure(s) Performed: COLONOSCOPY EGD (ESOPHAGOGASTRODUODENOSCOPY) DILATION, ESOPHAGUS  Patient location during evaluation: Short Stay Anesthesia Type: MAC Level of consciousness: awake and alert Pain management: pain level controlled Vital Signs Assessment: post-procedure vital signs reviewed and stable Respiratory status: spontaneous breathing Cardiovascular status: stable Postop Assessment: no apparent nausea or vomiting Anesthetic complications: no   No notable events documented.   Last Vitals:  Vitals:   08/11/24 0910 08/11/24 1133  BP: 130/89 (!) 116/93  Pulse: 78 78  Resp: 16 16  Temp: 36.8 C 36.8 C  SpO2: 95% 98%    Last Pain:  Vitals:   08/11/24 1133  TempSrc: Oral  PainSc: 0-No pain                 Gerline Ratto

## 2024-08-12 ENCOUNTER — Encounter (HOSPITAL_COMMUNITY): Payer: Self-pay | Admitting: Gastroenterology

## 2024-08-13 LAB — SURGICAL PATHOLOGY

## 2024-08-16 ENCOUNTER — Ambulatory Visit (INDEPENDENT_AMBULATORY_CARE_PROVIDER_SITE_OTHER): Payer: Self-pay | Admitting: Gastroenterology

## 2024-08-16 DIAGNOSIS — B3781 Candidal esophagitis: Secondary | ICD-10-CM

## 2024-08-16 MED ORDER — FLUCONAZOLE 200 MG PO TABS: ORAL_TABLET | ORAL | 0 refills | Status: AC

## 2024-08-19 NOTE — Progress Notes (Signed)
 5 yr TCS noted in recall Patient result letter mailed Patient's PCP is on EPIC

## 2024-08-31 ENCOUNTER — Other Ambulatory Visit (HOSPITAL_COMMUNITY): Payer: Self-pay

## 2024-09-14 ENCOUNTER — Encounter (INDEPENDENT_AMBULATORY_CARE_PROVIDER_SITE_OTHER): Payer: Self-pay | Admitting: Gastroenterology

## 2024-10-04 ENCOUNTER — Other Ambulatory Visit (HOSPITAL_COMMUNITY): Payer: Self-pay
